# Patient Record
Sex: Male | Born: 1950 | Race: White | Hispanic: No | Marital: Married | Smoking: Never smoker
Health system: Southern US, Community
[De-identification: ages and names within clinical notes are randomized; demographics above are authoritative.]

## PROBLEM LIST (undated history)

## (undated) DIAGNOSIS — I10 Essential (primary) hypertension: Secondary | ICD-10-CM

## (undated) DIAGNOSIS — C73 Malignant neoplasm of thyroid gland: Secondary | ICD-10-CM

## (undated) DIAGNOSIS — E119 Type 2 diabetes mellitus without complications: Secondary | ICD-10-CM

## (undated) DIAGNOSIS — Z8489 Family history of other specified conditions: Secondary | ICD-10-CM

## (undated) DIAGNOSIS — N400 Enlarged prostate without lower urinary tract symptoms: Secondary | ICD-10-CM

## (undated) HISTORY — PX: LAPAROSCOPIC CHOLECYSTECTOMY: SUR755

## (undated) HISTORY — PX: LYMPH NODE BIOPSY: SHX201

---

## 2001-09-25 ENCOUNTER — Encounter: Payer: Self-pay | Admitting: Emergency Medicine

## 2001-09-25 ENCOUNTER — Inpatient Hospital Stay (HOSPITAL_COMMUNITY): Admission: EM | Admit: 2001-09-25 | Discharge: 2001-09-29 | Payer: Self-pay | Admitting: Emergency Medicine

## 2001-09-28 ENCOUNTER — Encounter: Payer: Self-pay | Admitting: General Surgery

## 2004-05-10 ENCOUNTER — Emergency Department (HOSPITAL_COMMUNITY): Admission: EM | Admit: 2004-05-10 | Discharge: 2004-05-10 | Payer: Self-pay | Admitting: Emergency Medicine

## 2004-12-17 ENCOUNTER — Emergency Department (HOSPITAL_COMMUNITY): Admission: EM | Admit: 2004-12-17 | Discharge: 2004-12-17 | Payer: Self-pay | Admitting: Emergency Medicine

## 2013-11-19 ENCOUNTER — Ambulatory Visit: Payer: BC Managed Care – PPO | Admitting: Family Medicine

## 2013-11-19 VITALS — BP 132/88 | HR 63 | Temp 98.9°F | Resp 16 | Ht 70.0 in | Wt 215.0 lb

## 2013-11-19 DIAGNOSIS — Z0289 Encounter for other administrative examinations: Secondary | ICD-10-CM

## 2013-11-19 NOTE — Progress Notes (Signed)
Games developer Medical Examination   Barry Gardner is a 63 y.o. male who presents today for a commercial driver fitness determination physical exam. The patient reports no problems. Pt prev PCP Dr. Hilda Lias (sp?) recently retired and he has not yet established w/ a new PCP - waiting to hear back from [facility]. He does not check his cbgs and has not seen a physician in about 6-12 mos though is still taking his medication. He does not know what his hgba1c is. The following portions of the patient's history were reviewed and updated as appropriate: allergies, current medications, past family history, past medical history, past social history, past surgical history and problem list. Review of Systems A comprehensive review of systems was negative.   Objective:    Vision:  Uncorrected Corrected Horizontal Field of Vision  Right Eye 20/25 na 85 degrees  Left Eye  20/40 na 85 degrees  Both Eyes  60/63 na    Applicant can recognize and distinguish among traffic control signals and devices showing standard red, green, and amber colors.     Monocular Vision?: No   Hearing:   500 Hz 1000 Hz 2000 Hz 4000 Hz  Right Ear  na na na na  Left Ear  na na na na  Can hear whisper at 10 feet bilaterally.     BP 132/88  Pulse 63  Temp(Src) 98.9 F (37.2 C) (Oral)  Resp 16  Ht 5\' 10"  (1.778 m)  Wt 215 lb (97.523 kg)  BMI 30.85 kg/m2  SpO2 97%  General Appearance:    Alert, cooperative, no distress, appears stated age  Head:    Normocephalic, without obvious abnormality, atraumatic  Eyes:    PERRL, conjunctiva/corneas clear, EOM's intact, fundi    benign, both eyes       Ears:    Normal TM's and external ear canals, both ears  Nose:   Nares normal, septum midline, mucosa normal, no drainage    or sinus tenderness  Throat:   Lips, mucosa, and tongue normal; teeth and gums normal  Neck:   Supple,  trachea midline, large left thyroid mass  Back:     Symmetric, no curvature, ROM normal,  no CVA tenderness  Lungs:     Left lower lobe expiratory wheeze, respirations unlabored  Chest wall:    No tenderness or deformity  Heart:    Regular rate and rhythm, S1 and S2 normal, no murmur, rub   or gallop  Abdomen:     Soft, non-tender, bowel sounds active all four quadrants,    no masses, no organomegaly  Genitalia:    Normal male without lesion, discharge or tenderness     Extremities:   Extremities normal, atraumatic, no cyanosis or edema, decreased ROM in left knee  Pulses:   2+ and symmetric all extremities, large amount of varicose veins over legs  Skin:   Skin color, texture, turgor normal, no rashes or lesions  Lymph nodes:   Cervical, supraclavicular, and axillary nodes normal  Neurologic:   CNII-XII intact. Normal strength, sensation and reflexes      throughout    Labs: No results found for this basename: SPECGRAV, PROTEINUR, BILIRUBINUR, GLUCOSEU  SG 1.030, neg prot, neg blood, 2+ sugar    Assessment:    Healthy male exam.  Meets standards, but periodic monitoring required due to HTN, DM.  Driver qualified only for 6 month.   Needs to establish w/ new PCP and bring letter stating that DM is well-controlled or  what changes are being made to get it well-controlled before new card in 6 mos. Pt understands and agrees to planh. Plan:    Medical examiners certificate completed and printed. Return as needed.

## 2014-05-20 ENCOUNTER — Ambulatory Visit (INDEPENDENT_AMBULATORY_CARE_PROVIDER_SITE_OTHER): Payer: BC Managed Care – PPO | Admitting: Family Medicine

## 2014-05-20 VITALS — BP 178/88 | HR 91 | Temp 98.4°F | Resp 18 | Ht 70.75 in | Wt 217.6 lb

## 2014-05-20 DIAGNOSIS — I10 Essential (primary) hypertension: Secondary | ICD-10-CM

## 2014-05-20 DIAGNOSIS — E118 Type 2 diabetes mellitus with unspecified complications: Secondary | ICD-10-CM

## 2014-05-20 LAB — POCT GLYCOSYLATED HEMOGLOBIN (HGB A1C): Hemoglobin A1C: 12.2

## 2014-05-20 LAB — GLUCOSE, POCT (MANUAL RESULT ENTRY): POC Glucose: 296 mg/dl — AB (ref 70–99)

## 2014-05-20 MED ORDER — TAMSULOSIN HCL 0.4 MG PO CAPS: 0.4000 mg | ORAL_CAPSULE | Freq: Every day | ORAL | Status: DC

## 2014-05-20 MED ORDER — FREESTYLE SYSTEM KIT: 1.0000 | PACK | Status: DC | PRN

## 2014-05-20 MED ORDER — QUINAPRIL HCL 40 MG PO TABS: 40.0000 mg | ORAL_TABLET | Freq: Every day | ORAL | Status: DC

## 2014-05-20 MED ORDER — METFORMIN HCL ER 500 MG PO TB24: 2000.0000 mg | ORAL_TABLET | Freq: Every day | ORAL | Status: DC

## 2014-05-20 MED ORDER — SITAGLIPTIN PHOSPHATE 100 MG PO TABS: 100.0000 mg | ORAL_TABLET | Freq: Every day | ORAL | Status: DC

## 2014-05-20 NOTE — Patient Instructions (Signed)
Diabetes and Standards of Medical Care Diabetes is complicated. You may find that your diabetes team includes a dietitian, nurse, diabetes educator, eye doctor, and more. To help everyone know what is going on and to help you get the care you deserve, the following schedule of care was developed to help keep you on track. Below are the tests, exams, vaccines, medicines, education, and plans you will need. HbA1c test This test shows how well you have controlled your glucose over the past 2-3 months. It is used to see if your diabetes management plan needs to be adjusted.   It is performed at least 2 times a year if you are meeting treatment goals.  It is performed 4 times a year if therapy has changed or if you are not meeting treatment goals. Blood pressure test  This test is performed at every routine medical visit. The goal is less than 140/90 mm Hg for most people, but 130/80 mm Hg in some cases. Ask your health care provider about your goal. Dental exam  Follow up with the dentist regularly. Eye exam  If you are diagnosed with type 1 diabetes as a child, get an exam upon reaching the age of 37 years or older and have had diabetes for 3-5 years. Yearly eye exams are recommended after that initial eye exam.  If you are diagnosed with type 1 diabetes as an adult, get an exam within 5 years of diagnosis and then yearly.  If you are diagnosed with type 2 diabetes, get an exam as soon as possible after the diagnosis and then yearly. Foot care exam  Visual foot exams are performed at every routine medical visit. The exams check for cuts, injuries, or other problems with the feet.  A comprehensive foot exam should be done yearly. This includes visual inspection as well as assessing foot pulses and testing for loss of sensation.  Check your feet nightly for cuts, injuries, or other problems with your feet. Tell your health care provider if anything is not healing. Kidney function test (urine  microalbumin)  This test is performed once a year.  Type 1 diabetes: The first test is performed 5 years after diagnosis.  Type 2 diabetes: The first test is performed at the time of diagnosis.  A serum creatinine and estimated glomerular filtration rate (eGFR) test is done once a year to assess the level of chronic kidney disease (CKD), if present. Lipid profile (cholesterol, HDL, LDL, triglycerides)  Performed every 5 years for most people.  The goal for LDL is less than 100 mg/dL. If you are at high risk, the goal is less than 70 mg/dL.  The goal for HDL is 40 mg/dL-50 mg/dL for men and 50 mg/dL-60 mg/dL for women. An HDL cholesterol of 60 mg/dL or higher gives some protection against heart disease.  The goal for triglycerides is less than 150 mg/dL. Influenza vaccine, pneumococcal vaccine, and hepatitis B vaccine  The influenza vaccine is recommended yearly.  It is recommended that people with diabetes who are over 24 years old get the pneumonia vaccine. In some cases, two separate shots may be given. Ask your health care provider if your pneumonia vaccination is up to date.  The hepatitis B vaccine is also recommended for adults with diabetes. Diabetes self-management education  Education is recommended at diagnosis and ongoing as needed. Treatment plan  Your treatment plan is reviewed at every medical visit. Document Released: 04/25/2009 Document Revised: 11/12/2013 Document Reviewed: 11/28/2012 [facility] Patient Information 2015 [place],  [employer]. This information is not intended to replace advice given to you by your health care provider. Make sure you discuss any questions you have with your health care provider. Diabetes Mellitus and Food It is important for you to manage your blood sugar (glucose) level. Your blood glucose level can be greatly affected by what you eat. Eating healthier foods in the appropriate amounts throughout the day at about the same time each day will  help you control your blood glucose level. It can also help slow or prevent worsening of your diabetes mellitus. Healthy eating may even help you improve the level of your blood pressure and reach or maintain a healthy weight.  HOW CAN FOOD AFFECT ME? Carbohydrates Carbohydrates affect your blood glucose level more than any other type of food. Your dietitian will help you determine how many carbohydrates to eat at each meal and teach you how to count carbohydrates. Counting carbohydrates is important to keep your blood glucose at a healthy level, especially if you are using insulin or taking certain medicines for diabetes mellitus. Alcohol Alcohol can cause sudden decreases in blood glucose (hypoglycemia), especially if you use insulin or take certain medicines for diabetes mellitus. Hypoglycemia can be a life-threatening condition. Symptoms of hypoglycemia (sleepiness, dizziness, and disorientation) are similar to symptoms of having too much alcohol.  If your health care provider has given you approval to drink alcohol, do so in moderation and use the following guidelines:  Women should not have more than one drink per day, and men should not have more than two drinks per day. One drink is equal to:  12 oz of beer.  5 oz of wine.  1 oz of hard liquor.  Do not drink on an empty stomach.  Keep yourself hydrated. Have water, diet soda, or unsweetened iced tea.  Regular soda, juice, and other mixers might contain a lot of carbohydrates and should be counted. WHAT FOODS ARE NOT RECOMMENDED? As you make food choices, it is important to remember that all foods are not the same. Some foods have fewer nutrients per serving than other foods, even though they might have the same number of calories or carbohydrates. It is difficult to get your body what it needs when you eat foods with fewer nutrients. Examples of foods that you should avoid that are high in calories and carbohydrates but low in  nutrients include:  Trans fats (most processed foods list trans fats on the Nutrition Facts label).  Regular soda.  Juice.  Candy.  Sweets, such as cake, pie, doughnuts, and cookies.  Fried foods. WHAT FOODS CAN I EAT? Have nutrient-rich foods, which will nourish your body and keep you healthy. The food you should eat also will depend on several factors, including:  The calories you need.  The medicines you take.  Your weight.  Your blood glucose level.  Your blood pressure level.  Your cholesterol level. You also should eat a variety of foods, including:  Protein, such as meat, poultry, fish, tofu, nuts, and seeds (lean animal proteins are best).  Fruits.  Vegetables.  Dairy products, such as milk, cheese, and yogurt (low fat is best).  Breads, grains, pasta, cereal, rice, and beans.  Fats such as olive oil, trans fat-free margarine, canola oil, avocado, and olives. DOES EVERYONE WITH DIABETES MELLITUS HAVE THE SAME MEAL PLAN? Because every person with diabetes mellitus is different, there is not one meal plan that works for everyone. It is very important that you meet with a  dietitian who will help you create a meal plan that is just right for you. Document Released: 03/25/2005 Document Revised: 07/03/2013 Document Reviewed: 05/25/2013 [facility] Patient Information 2015 [place], [place]. This information is not intended to replace advice given to you by your health care provider. Make sure you discuss any questions you have with your health care provider.

## 2014-05-21 LAB — COMPREHENSIVE METABOLIC PANEL
ALBUMIN: 4.4 g/dL (ref 3.5–5.2)
ALT: 14 U/L (ref 0–53)
AST: 12 U/L (ref 0–37)
Alkaline Phosphatase: 75 U/L (ref 39–117)
BUN: 15 mg/dL (ref 6–23)
CHLORIDE: 97 meq/L (ref 96–112)
CO2: 30 meq/L (ref 19–32)
CREATININE: 1.08 mg/dL (ref 0.50–1.35)
Calcium: 9.5 mg/dL (ref 8.4–10.5)
Glucose, Bld: 325 mg/dL — ABNORMAL HIGH (ref 70–99)
POTASSIUM: 3.8 meq/L (ref 3.5–5.3)
Sodium: 135 mEq/L (ref 135–145)
TOTAL PROTEIN: 6.8 g/dL (ref 6.0–8.3)
Total Bilirubin: 0.5 mg/dL (ref 0.2–1.2)

## 2014-05-21 LAB — MICROALBUMIN, URINE: Microalb, Ur: 1.3 mg/dL (ref <2.0)

## 2014-05-22 ENCOUNTER — Ambulatory Visit (INDEPENDENT_AMBULATORY_CARE_PROVIDER_SITE_OTHER): Payer: Self-pay | Admitting: Family Medicine

## 2014-05-22 ENCOUNTER — Encounter: Payer: Self-pay | Admitting: Family Medicine

## 2014-05-22 VITALS — BP 140/74 | HR 70 | Temp 98.4°F | Resp 16 | Ht 70.5 in | Wt 216.0 lb

## 2014-05-22 DIAGNOSIS — Z021 Encounter for pre-employment examination: Secondary | ICD-10-CM

## 2014-05-22 DIAGNOSIS — Z0000 Encounter for general adult medical examination without abnormal findings: Secondary | ICD-10-CM

## 2014-05-22 NOTE — Progress Notes (Signed)
Subjective:   This chart was scribed for Barry Haber, MD by Barry Gardner, [facility] Scribe. This patient was seen in room 11 and the patient's care was started 8:10 PM.    Patient ID: Barry Gardner, male    DOB: 12-28-1950, 63 y.o.   MRN: [id number]  HPI  Chief Complaint  Patient presents with   Employment Physical    DOT    HPI Comments: Barry Gardner is a 63 y.o. 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 Last A1C reading 12.2 2 days ago. Pt is not currently driving but is planning to keep his license active. Barry Gardner was started on Januvia yesterday 11/10 and has noticed improvement in blood pressure readings since restarting all medications. No known allergies to medications. No other concerns this visit.  There are no active problems to display for this patient.  Past Medical History  Diagnosis Date   Diabetes mellitus without complication    Past Surgical History  Procedure Laterality Date   Cholecystectomy     Gallbladder surgery     No Known Allergies Prior to Admission medications   Medication Sig Start Date End Date Taking? Authorizing Provider  Ascorbic Acid (VITAMIN C) 1000 MG tablet Take 1,000 mg by mouth daily.   Yes Historical Provider, MD  aspirin 81 MG tablet Take 81 mg by mouth daily.   Yes Historical Provider, MD  bisoprolol-hydrochlorothiazide (ZIAC) 10-6.25 MG per tablet Take 1 tablet by mouth daily.   Yes Historical Provider, MD  Cholecalciferol (VITAMIN D-3) 5000 UNITS TABS Take by mouth.   Yes Historical Provider, MD  glucose monitoring kit (FREESTYLE) monitoring kit 1 each by Does not apply route as needed for other. 05/20/14  Yes Shawnee Knapp, MD  LYCOPENE PO Take by mouth daily.   Yes Historical Provider, MD  metFORMIN (GLUCOPHAGE-XR) 500 MG 24 hr tablet Take 4 tablets (2,000 mg total) by mouth daily with breakfast. 05/20/14  Yes Shawnee Knapp, MD    multivitamin-iron-minerals-folic acid (CENTRUM) chewable tablet Chew 1 tablet by mouth daily.   Yes Historical Provider, MD  quinapril (ACCUPRIL) 40 MG tablet Take 1 tablet (40 mg total) by mouth daily. 05/20/14  Yes Shawnee Knapp, MD  Saw Palmetto, Serenoa repens, (SAW PALMETTO PO) Take by mouth 2 (two) times daily.   Yes Historical Provider, MD  sitaGLIPtin (JANUVIA) 100 MG tablet Take 1 tablet (100 mg total) by mouth daily. 05/20/14  Yes Shawnee Knapp, MD  tamsulosin (FLOMAX) 0.4 MG CAPS capsule Take 1 capsule (0.4 mg total) by mouth daily. 05/20/14  Yes Shawnee Knapp, MD    Review of Systems  Constitutional: Negative for fever, chills and unexpected weight change.  Respiratory: Negative for shortness of breath.   Skin: Negative for rash.  Neurological: Negative for weakness and numbness.  Psychiatric/Behavioral: Negative for confusion.  All other systems reviewed and are negative.   Triage Vitals: BP 140/74 mmHg   Pulse 70   Temp(Src) 98.4 F (36.9 C) (Oral)   Resp 16   Ht 5' 10.5" (1.791 m)   Wt 216 lb (97.977 kg)   BMI 30.54 kg/m2   SpO2 97%   Objective:  Physical Exam  Constitutional: He is oriented to person, place, and time. He appears well-developed and well-nourished.  HENT:  Head: Normocephalic and atraumatic.  Eyes: EOM are normal.  Neck: Normal range of motion.  Cardiovascular: Normal rate, regular rhythm, normal heart sounds and intact distal pulses.   Pulmonary/Chest: Effort normal and breath sounds normal. No respiratory distress.  Abdominal: Soft. He exhibits no distension. There is no tenderness.  Musculoskeletal: Normal range of motion.  Neurological: He is alert and oriented to person, place, and time.  Skin: Skin is warm and dry.  Psychiatric: He has a normal mood and affect. Judgment normal.  Nursing note and vitals reviewed.    Assessment & Plan:   I personally performed the services described in this documentation, which was scribed in my presence. The recorded  information has been reviewed and is accurate.    No diagnosis found.  Signed, Barry Haber, MD

## 2014-05-22 NOTE — Progress Notes (Signed)
Subjective:  This chart was scribed for Barry Gardner. Barry Pulse, MD by Barry Gardner, ED Scribe. This patient was seen in room 13 and the patient's care was started at 9:30 PM.     Patient ID: Barry Gardner, male    DOB: [birth year], 63 y.o.   MRN: [id number] Chief Complaint  Patient presents with  . Employment Physical    dot    HPI PCP: No PCP Per Patient HPI Comments: Barry Gardner is a 63 y.o. male who presents to the [facility] for his self-pay DOT exam but agrees to be seen for eval of his DM and HTN instead as informed would not be eligible for >1 mo DOT card with his current VS and UA.  Pt is here with his daughter who lives w/ him and keeps track of his medication and compliance.  Pt reports taking metformin ER 500 mg 2 tabs bid. Pt gets his meds free from [employer]. Wife also has DM and pt takes his wife's Metformin when he does not have his own. Pt's daughter recently moved in and is taking control over managing pt's health including controlling his DM. Pt denies checking his glucose levels at home. Pt is also on Accupril 62m, however, he failed to take his it today. Pt presents to the clinic without fasting and states that he had a carbohydrate rich dinner two hours ago. Pt currently lacks a PCP and is in the process of establishing care at the clinic; pt notes that he has not seen a provider in over a year. Pt reports chronic nocturia 3x a night and is on flomax. Pt has no specific complaints at this time and denies any Sx.   Past Medical History  Diagnosis Date  . Diabetes mellitus without complication    No current outpatient prescriptions on file prior to visit.   No current facility-administered medications on file prior to visit.   No Known Allergies  Review of Systems  Constitutional: Negative for fever, chills, activity change, appetite change and unexpected weight change.  Respiratory: Negative for shortness of breath.   Cardiovascular: Negative for  chest pain and leg swelling.  Gastrointestinal: Negative for nausea, vomiting, abdominal pain, diarrhea and constipation.  Neurological: Negative for numbness.  Psychiatric/Behavioral: Negative for confusion and sleep disturbance.       Objective:   Physical Exam  Constitutional: He is oriented to person, place, and time. He appears well-developed and well-nourished. No distress.  HENT:  Head: Normocephalic and atraumatic.  Normal thyroid.   Eyes: Conjunctivae and EOM are normal.  Neck: Neck supple.  Cardiovascular: Normal rate and regular rhythm.   Pulmonary/Chest: Effort normal and breath sounds normal. No respiratory distress.  Musculoskeletal: Normal range of motion.  Neurological: He is alert and oriented to person, place, and time.  Skin: Skin is warm and dry.  Psychiatric: He has a normal mood and affect. His behavior is normal.  Nursing note and vitals reviewed.  BP 178/88 mmHg  Gardner 91  Temp(Src) 98.4 F (36.9 C) (Oral)  Resp 18  Ht 5' 10.75" (1.797 m)  Wt 217 lb 9.6 oz (98.703 kg)  BMI 30.57 kg/m2  SpO2 96%  Results for orders placed or performed in visit on 05/20/14  Microalbumin, urine  Result Value Ref Range   Microalb, Ur 1.3 <2.0 mg/dL  Comprehensive metabolic panel  Result Value Ref Range   Sodium 135 135 - 145 mEq/L   Potassium 3.8 3.5 - 5.3 mEq/L  Chloride 97 96 - 112 mEq/L   CO2 30 19 - 32 mEq/L   Glucose, Bld 325 (H) 70 - 99 mg/dL   BUN 15 6 - 23 mg/dL   Creat 1.08 0.50 - 1.35 mg/dL   Total Bilirubin 0.5 0.2 - 1.2 mg/dL   Alkaline Phosphatase 75 39 - 117 U/L   AST 12 0 - 37 U/L   ALT 14 0 - 53 U/L   Total Protein 6.8 6.0 - 8.3 g/dL   Albumin 4.4 3.5 - 5.2 g/dL   Calcium 9.5 8.4 - 10.5 mg/dL  POCT glucose (manual entry)  Result Value Ref Range   POC Glucose 296 (A) 70 - 99 mg/dl  POCT glycosylated hemoglobin (Hb A1C)  Result Value Ref Range   Hemoglobin A1C 12.2        Assessment & Plan:  DIAGNOSTIC STUDIES: Oxygen Saturation is 96%  on RA, adequate by my interpretation.    COORDINATION OF CARE: 9:45 PM-Discussed treatment plan which includes recheck with pt at bedside and pt agreed to plan.  Type 2 diabetes mellitus with complication - Plan: POCT glucose (manual entry), POCT glycosylated hemoglobin (Hb A1C), Microalbumin, urine, Comprehensive metabolic panel, Ambulatory referral to Ophthalmology - I saw pt 6 mos ago for his DOT and requested he bring a note from his provider re his DM control before renewal.  He has not seen a provider - plans to establish medical care here so visit tonight changed to establish medical care as I will not renew DOT card for > 1 mo with current BP and glucosuria - informed pt he can RTC in 1-2d to see another provider for his DOT.  Cont metformin - discussed ways to increase compliance w/ bid dosing by using ER formulation and ok to combine to qd dosing.  Start [place] - recommended sulfonylurea to start after metformin but want to try Januvia as wife is doing well on it as well and has coupon. Start asa.  Needs flp at f/u Pt requests all labs be sent to [facility] for his insurance cvg.   Essential hypertension - Plan: Comprehensive metabolic panel - was prev well controlled on accupril - restart.   Meds ordered this encounter  Medications  . multivitamin-iron-minerals-folic acid (CENTRUM) chewable tablet    Sig: Chew 1 tablet by mouth daily.  Marland Kitchen aspirin 81 MG tablet    Sig: Take 81 mg by mouth daily.  . metFORMIN (GLUCOPHAGE-XR) 500 MG 24 hr tablet    Sig: Take 4 tablets (2,000 mg total) by mouth daily with breakfast.    Dispense:  360 tablet    Refill:  1    This is a 3 month supply. Ok to dispense at 1 mo at a time if pt prefers  . sitaGLIPtin (JANUVIA) 100 MG tablet    Sig: Take 1 tablet (100 mg total) by mouth daily.    Dispense:  90 tablet    Refill:  1    This is a 3 month supply. Ok to dispense at 1 mo at a time if pt prefers  . quinapril (ACCUPRIL) 40 MG tablet    Sig: Take 1 tablet  (40 mg total) by mouth daily.    Dispense:  90 tablet    Refill:  1  . tamsulosin (FLOMAX) 0.4 MG CAPS capsule    Sig: Take 1 capsule (0.4 mg total) by mouth daily.    Dispense:  90 capsule    Refill:  1  . DISCONTD: glucose monitoring kit (  FREESTYLE) monitoring kit    Sig: 1 each by Does not apply route as needed for other. 100 lancets and 100 test strips, with 1 year refills. Check sugars qd. Patient wants Freestyle kit.    Dispense:  1 each    Refill:  0  . DISCONTD: glucose monitoring kit (FREESTYLE) monitoring kit    Sig: 1 each by Does not apply route as needed for other. 100 lancets and 100 test strips, with 1 year refills. Check sugars qd. Patient wants Freestyle kit.    Dispense:  1 each    Refill:  0  . glucose monitoring kit (FREESTYLE) monitoring kit    Sig: 1 each by Does not apply route as needed for other.    Dispense:  1 each    Refill:  0    100 lancets and 100 test strips, with 1 year refills. Check sugars qd. Patient wants Freestyle kit.    I personally performed the services described in this documentation, which was scribed in my presence. The recorded information has been reviewed and considered, and addended by me as needed.  [name], MD MPH

## 2014-05-23 ENCOUNTER — Other Ambulatory Visit: Payer: Self-pay

## 2014-05-23 MED ORDER — BISOPROLOL-HYDROCHLOROTHIAZIDE 10-6.25 MG PO TABS: 1.0000 | ORAL_TABLET | Freq: Every day | ORAL | Status: DC

## 2014-05-23 NOTE — Telephone Encounter (Signed)
pts daughter called and stated that she had the med asst make a note that pt's Ziac Rx needed to be sent in, but she doesn't think it was done bc they did not discuss with Dr L. I don't see that it was sent. Dr L, pt had restarted back on this med and his BP was already improving, so she is sure you probably want him to remain on this. Daughter had not realized pt was supposed to be taking this when they were here to see Dr Brigitte Pulse the day before. Can we send Rx for pt?

## 2014-05-24 ENCOUNTER — Telehealth: Payer: Self-pay

## 2014-05-24 NOTE — Telephone Encounter (Signed)
Called pt to see if he wants to get DM supplies through [facility] and was advised that he gets all of these from [facility] or [facility]. We had just sent a Rx to [facility]. Denied order to [facility]

## 2014-10-21 ENCOUNTER — Other Ambulatory Visit: Payer: Self-pay | Admitting: Family Medicine

## 2014-11-15 ENCOUNTER — Other Ambulatory Visit: Payer: Self-pay | Admitting: Physician Assistant

## 2014-12-15 ENCOUNTER — Other Ambulatory Visit: Payer: Self-pay | Admitting: Physician Assistant

## 2014-12-30 ENCOUNTER — Ambulatory Visit (INDEPENDENT_AMBULATORY_CARE_PROVIDER_SITE_OTHER): Payer: BLUE CROSS/BLUE SHIELD | Admitting: Family Medicine

## 2014-12-30 VITALS — BP 164/76 | HR 76 | Temp 97.7°F | Resp 18 | Ht 71.5 in | Wt 229.0 lb

## 2014-12-30 DIAGNOSIS — E1165 Type 2 diabetes mellitus with hyperglycemia: Secondary | ICD-10-CM | POA: Diagnosis not present

## 2014-12-30 DIAGNOSIS — E559 Vitamin D deficiency, unspecified: Secondary | ICD-10-CM | POA: Diagnosis not present

## 2014-12-30 DIAGNOSIS — N400 Enlarged prostate without lower urinary tract symptoms: Secondary | ICD-10-CM

## 2014-12-30 DIAGNOSIS — I10 Essential (primary) hypertension: Secondary | ICD-10-CM | POA: Insufficient documentation

## 2014-12-30 DIAGNOSIS — IMO0002 Reserved for concepts with insufficient information to code with codable children: Secondary | ICD-10-CM | POA: Insufficient documentation

## 2014-12-30 LAB — POCT GLYCOSYLATED HEMOGLOBIN (HGB A1C): HEMOGLOBIN A1C: 7.3

## 2014-12-30 MED ORDER — QUINAPRIL HCL 40 MG PO TABS: 40.0000 mg | ORAL_TABLET | Freq: Every day | ORAL | Status: DC

## 2014-12-30 MED ORDER — HYDROCHLOROTHIAZIDE 25 MG PO TABS: 25.0000 mg | ORAL_TABLET | Freq: Every day | ORAL | Status: DC

## 2014-12-30 MED ORDER — TAMSULOSIN HCL 0.4 MG PO CAPS: 0.4000 mg | ORAL_CAPSULE | Freq: Every day | ORAL | Status: DC

## 2014-12-30 MED ORDER — METFORMIN HCL ER (OSM) 1000 MG PO TB24: 2000.0000 mg | ORAL_TABLET | Freq: Every day | ORAL | Status: DC

## 2014-12-30 MED ORDER — ATENOLOL 100 MG PO TABS: 100.0000 mg | ORAL_TABLET | Freq: Every day | ORAL | Status: DC

## 2014-12-30 MED ORDER — SITAGLIPTIN PHOSPHATE 100 MG PO TABS: 100.0000 mg | ORAL_TABLET | Freq: Every day | ORAL | Status: DC

## 2014-12-30 NOTE — Progress Notes (Signed)
Subjective:  This chart was scribed for Barry Cheadle, MD by [name], Medical Scribe. This patient was seen in Room 14 and the patient's care was started 7:15 PM.    Patient ID: Barry Gardner, male    DOB: 02/21/1951, 64 y.o.   MRN: [id number] Chief Complaint  Patient presents with  . diabetic check up    3 mth diabetic f/u    HPI Barry Gardner is a 64 y.o. male who presents to [facility] for diabetic follow up.  Last saw pt 7 months ago. He has type 2 DM and also high BP. 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 He has very poor disease education.  His a1c was 12.2. On metformin 1000 BID.  His BP is also very uncontrolled. On accupril.  At last visit, we increased his medication compliance changing metformin to XR dosing taken with breakfast with [place]. Started aspirin and restarted his accupril. Advised for fasting lipid panel.  Normal microalbumin in Nov.   He is not checking his blood sugar regularly. He states that he's been feeling fatigue as he works 7 days a week. He does have a meter at home. He still takes medicine regularly.  He took his medication today. He has eaten today 8 hours ago.   He's on Centrum Silver for men. He has seen his eye doctor in past year. He denies sores on his feet.   Past Medical History  Diagnosis Date  . Diabetes mellitus without complication    Current Outpatient Prescriptions on File Prior to Visit  Medication Sig Dispense Refill  . Ascorbic Acid (VITAMIN C) 1000 MG tablet Take 1,000 mg by mouth daily.    Marland Kitchen aspirin 81 MG tablet Take 81 mg by mouth daily.    . bisoprolol-hydrochlorothiazide (ZIAC) 10-6.25 MG per tablet TAKE 1 TABLET BY MOUTH AT BEDTIME.  "OV NEEDED FOR ADDITIONAL REFILLS" 30 tablet 0  . Cholecalciferol (VITAMIN D-3) 5000 UNITS TABS Take by mouth.    Marland Kitchen glucose monitoring kit (FREESTYLE) monitoring kit 1 each by Does not apply route as needed for other. 1 each 0  . LYCOPENE PO Take by mouth  daily.    . metFORMIN (GLUCOPHAGE-XR) 500 MG 24 hr tablet Take 4 tablets (2,000 mg total) by mouth daily with breakfast. NO MORE REFILLS WITHOUT OFFICE VISIT - 2ND NOTICE 60 tablet 0  . multivitamin-iron-minerals-folic acid (CENTRUM) chewable tablet Chew 1 tablet by mouth daily.    . quinapril (ACCUPRIL) 40 MG tablet Take 1 tablet (40 mg total) by mouth daily. NO MORE REFILLS WITHOUT OFFICE VISIT - 2ND NOTICE 15 tablet 0  . Saw Palmetto, Serenoa repens, (SAW PALMETTO PO) Take by mouth 2 (two) times daily.    . sitaGLIPtin (JANUVIA) 100 MG tablet Take 1 tablet (100 mg total) by mouth daily. NO MORE REFILLS WITHOUT OFFICE VISIT - 2ND NOTICE 15 tablet 0  . tamsulosin (FLOMAX) 0.4 MG CAPS capsule Take 1 capsule (0.4 mg total) by mouth daily. NO MORE REFILLS WITHOUT OFFICE VISIT - 2ND NOTICE 15 capsule 0   No current facility-administered medications on file prior to visit.   No Known Allergies    Review of Systems  Constitutional: Positive for fatigue. Negative for fever, chills and appetite change.  HENT: Negative for congestion, rhinorrhea, sneezing and sore throat.   Respiratory: Negative for cough and shortness of breath.   Gastrointestinal: Negative for nausea, vomiting and diarrhea.  Neurological: Negative  for numbness.       Objective:   Physical Exam  Constitutional: He is oriented to person, place, and time. He appears well-developed and well-nourished. No distress.  HENT:  Head: Normocephalic and atraumatic.  Eyes: EOM are normal. Pupils are equal, round, and reactive to light.  Neck: Neck supple.  Cardiovascular: Normal rate.   Pulmonary/Chest: Effort normal. No respiratory distress.  Musculoskeletal: Normal range of motion.  Neurological: He is alert and oriented to person, place, and time.  Skin: Skin is warm and dry.  Psychiatric: He has a normal mood and affect. His behavior is normal.  Nursing note and vitals reviewed.   BP 164/76 mmHg  Pulse 76  Temp(Src) 97.7 F  (36.5 C) (Oral)  Resp 18  Ht 5' 11.5" (1.816 m)  Wt 229 lb (103.874 kg)  BMI 31.50 kg/m2  SpO2 96%  Rechecked BP seated, reg cuff manually, in room: 170/80  Results for orders placed or performed in visit on 12/30/14  POCT glycosylated hemoglobin (Hb A1C)  Result Value Ref Range   Hemoglobin A1C 7.3         Assessment & Plan:  His daughter wants to know if he needs to start fish oil which I think is a good idea.  Trying to minimalize number of pills to pt compliance  1. Type 2 diabetes mellitus, uncontrolled - doing AMAZING - a1c decreased from 12.2 ->7.3 in the past 7 mos since his daughter moved in and started cooking and giving him his meds. Januvia costing pt $35/mo - we started this at pt request as his wife did well on it and he had a coupon but would be cheaper to put on sulfonylurea or invokana with coupon so call if need to change.   Daughter request metformin ER from 4 tabs a day to 2 tabs, even though more expensive, to increase compliance - ok to change to any form of the ER (mod, osm, reg) in any quantity that works best for cost as long as taking in 2083m total/d.  Cont asa, microalb 7 mos ago nml and on acei. If BP elev pt instructed to call and would have to add in amlodipine. Lipids at goal and will hold off on statin at this time as more pills is likely to decrease his compliance per pt and his daughter.   2. Essential hypertension, benign - start monitoring outside office - has to be <140/90 to keep his DOT card- going to need new card in Nov and plans to come here for it.  Change bisoprolol-hctz 10-6.25 qd to atenolol 100 and hctz 25 qd, cont accupril 40 qd - daughter worried about pt's compliance taking 3 pills instead of 2 but no way increase total doses w/o this (accupril/hctz is max at 20/25, bisoprolol highest pill is 10 but max dose is 20 so switched to atenolol.   3. Vitamin D deficiency - finishing 5000u qd and going to start 2000qd  4. BPH (benign prostatic  hypertrophy) - on flomax, no recent change in sxs, not seeing urology so needs psa - rec DRE at next CPE (or DOT exam).    Orders Placed This Encounter  Procedures  . Vit D  25 hydroxy (rtn osteoporosis monitoring)  . Lipid panel    Order Specific Question:  Has the patient fasted?    Answer:  Yes  . Comprehensive metabolic panel    Order Specific Question:  Has the patient fasted?    Answer:  Yes  .  PSA  . POCT glycosylated hemoglobin (Hb A1C)    Meds ordered this encounter  Medications  . b complex vitamins capsule    Sig: Take 1 capsule by mouth daily.  . quinapril (ACCUPRIL) 40 MG tablet    Sig: Take 1 tablet (40 mg total) by mouth daily.    Dispense:  30 tablet    Refill:  5  . hydrochlorothiazide (HYDRODIURIL) 25 MG tablet    Sig: Take 1 tablet (25 mg total) by mouth daily.    Dispense:  30 tablet    Refill:  5  . atenolol (TENORMIN) 100 MG tablet    Sig: Take 1 tablet (100 mg total) by mouth daily.    Dispense:  30 tablet    Refill:  5  . metFORMIN (FORTAMET) 1000 MG (OSM) 24 hr tablet    Sig: Take 2 tablets (2,000 mg total) by mouth daily with breakfast.    Dispense:  60 tablet    Refill:  11  . tamsulosin (FLOMAX) 0.4 MG CAPS capsule    Sig: Take 1 capsule (0.4 mg total) by mouth daily.    Dispense:  30 capsule    Refill:  11  . sitaGLIPtin (JANUVIA) 100 MG tablet    Sig: Take 1 tablet (100 mg total) by mouth daily.    Dispense:  30 tablet    Refill:  5    I personally performed the services described in this documentation, which was scribed in my presence. The recorded information has been reviewed and considered, and addended by me as needed.  Barry Cheadle, MD MPH

## 2014-12-30 NOTE — Patient Instructions (Addendum)
Continue the accupril.  Instead of the bisoprolol-hctz 10-6.25 which increased to atenolol 100 (max dose of the bisoprolol was 20) and increase hctz to 25 (normal dose). Diabetes doing amazing!!!!!!!!!!!!!!!!!!!!!!  Keep it up!!!!!!!!!!!!!!!!!  Managing Your High Blood Pressure Blood pressure is a measurement of how forceful your blood is pressing against the walls of the arteries. Arteries are muscular tubes within the circulatory system. Blood pressure does not stay the same. Blood pressure rises when you are active, excited, or nervous; and it lowers during sleep and relaxation. If the numbers measuring your blood pressure stay above normal most of the time, you are at risk for health problems. High blood pressure (hypertension) is a long-term (chronic) condition in which blood pressure is elevated. A blood pressure reading is recorded as two numbers, such as 120 over 80 (or 120/80). The first, higher number is called the systolic pressure. It is a measure of the pressure in your arteries as the heart beats. The second, lower number is called the diastolic pressure. It is a measure of the pressure in your arteries as the heart relaxes between beats.  Keeping your blood pressure in a normal range is important to your overall health and prevention of health problems, such as heart disease and stroke. When your blood pressure is uncontrolled, your heart has to work harder than normal. High blood pressure is a very common condition in adults because blood pressure tends to rise with age. Men and women are equally likely to have hypertension but at different times in life. Before age 6, men are more likely to have hypertension. After 65 years of age, women are more likely to have it. Hypertension is especially common in African Americans. This condition often has no signs or symptoms. The cause of the condition is usually not known. Your caregiver can help you come up with a plan to keep your blood pressure in  a normal, healthy range. BLOOD PRESSURE STAGES Blood pressure is classified into four stages: normal, prehypertension, stage 1, and stage 2. Your blood pressure reading will be used to determine what type of treatment, if any, is necessary. Appropriate treatment options are tied to these four stages:  Normal  Systolic pressure (mm Hg): below 120.  Diastolic pressure (mm Hg): below 80. Prehypertension  Systolic pressure (mm Hg): 120 to 139.  Diastolic pressure (mm Hg): 80 to 89. Stage1  Systolic pressure (mm Hg): 140 to 159.  Diastolic pressure (mm Hg): 90 to 99. Stage2  Systolic pressure (mm Hg): 160 or above.  Diastolic pressure (mm Hg): 100 or above. RISKS RELATED TO HIGH BLOOD PRESSURE Managing your blood pressure is an important responsibility. Uncontrolled high blood pressure can lead to:  A heart attack.  A stroke.  A weakened blood vessel (aneurysm).  Heart failure.  Kidney damage.  Eye damage.  Metabolic syndrome.  Memory and concentration problems. HOW TO MANAGE YOUR BLOOD PRESSURE Blood pressure can be managed effectively with lifestyle changes and medicines (if needed). Your caregiver will help you come up with a plan to bring your blood pressure within a normal range. Your plan should include the following: Education  Read all information provided by your caregivers about how to control blood pressure.  Educate yourself on the latest guidelines and treatment recommendations. New research is always being done to further define the risks and treatments for high blood pressure. Lifestylechanges  Control your weight.  Avoid smoking.  Stay physically active.  Reduce the amount of salt in your diet.  Reduce stress.  Control any chronic conditions, such as high cholesterol or diabetes.  Reduce your alcohol intake. Medicines  Several medicines (antihypertensive medicines) are available, if needed, to bring blood pressure within a normal  range. Communication  Review all the medicines you take with your caregiver because there may be side effects or interactions.  Talk with your caregiver about your diet, exercise habits, and other lifestyle factors that may be contributing to high blood pressure.  See your caregiver regularly. Your caregiver can help you create and adjust your plan for managing high blood pressure. RECOMMENDATIONS FOR TREATMENT AND FOLLOW-UP  The following recommendations are based on current guidelines for managing high blood pressure in nonpregnant adults. Use these recommendations to identify the proper follow-up period or treatment option based on your blood pressure reading. You can discuss these options with your caregiver.  Systolic pressure of 322 to 025 or diastolic pressure of 80 to 89: Follow up with your caregiver as directed.  Systolic pressure of 427 to 062 or diastolic pressure of 90 to 100: Follow up with your caregiver within 2 months.  Systolic pressure above 376 or diastolic pressure above 283: Follow up with your caregiver within 1 month.  Systolic pressure above 151 or diastolic pressure above 761: Consider antihypertensive therapy; follow up with your caregiver within 1 week.  Systolic pressure above 607 or diastolic pressure above 371: Begin antihypertensive therapy; follow up with your caregiver within 1 week. Document Released: 03/22/2012 Document Reviewed: 03/22/2012 [facility] Patient Information 2015 [place]. This information is not intended to replace advice given to you by your health care provider. Make sure you discuss any questions you have with your health care provider.  DASH Eating Plan DASH stands for "Dietary Approaches to Stop Hypertension." The DASH eating plan is a healthy eating plan that has been shown to reduce high blood pressure (hypertension). Additional health benefits may include reducing the risk of type 2 diabetes mellitus, heart disease, and stroke.  The DASH eating plan may also help with weight loss. WHAT DO I NEED TO KNOW ABOUT THE DASH EATING PLAN? For the DASH eating plan, you will follow these general guidelines:  Choose foods with a percent daily value for sodium of less than 5% (as listed on the food label).  Use salt-free seasonings or herbs instead of table salt or sea salt.  Check with your health care provider or pharmacist before using salt substitutes.  Eat lower-sodium products, often labeled as "lower sodium" or "no salt added."  Eat fresh foods.  Eat more vegetables, fruits, and low-fat dairy products.  Choose whole grains. Look for the word "whole" as the first word in the ingredient list.  Choose fish and skinless chicken or [place] more often than red meat. Limit fish, poultry, and meat to 6 oz (170 g) each day.  Limit sweets, desserts, sugars, and sugary drinks.  Choose heart-healthy fats.  Limit cheese to 1 oz (28 g) per day.  Eat more home-cooked food and less restaurant, buffet, and fast food.  Limit fried foods.  Cook foods using methods other than frying.  Limit canned vegetables. If you do use them, rinse them well to decrease the sodium.  When eating at a restaurant, ask that your food be prepared with less salt, or no salt if possible. WHAT FOODS CAN I EAT? Seek help from a dietitian for individual calorie needs. Grains Whole grain or whole wheat bread. Brown rice. Whole grain or whole wheat pasta. Quinoa, bulgur, and whole  grain cereals. Low-sodium cereals. Corn or whole wheat flour tortillas. Whole grain cornbread. Whole grain crackers. Low-sodium crackers. Vegetables Fresh or frozen vegetables (raw, steamed, roasted, or grilled). Low-sodium or reduced-sodium tomato and vegetable juices. Low-sodium or reduced-sodium tomato sauce and paste. Low-sodium or reduced-sodium canned vegetables.  Fruits All fresh, canned (in natural juice), or frozen fruits. Meat and Other Protein Products Ground  beef (85% or leaner), grass-fed beef, or beef trimmed of fat. Skinless chicken or [place]. Ground chicken or [place]. Pork trimmed of fat. All fish and seafood. Eggs. Dried beans, peas, or lentils. Unsalted nuts and seeds. Unsalted canned beans. Dairy Low-fat dairy products, such as skim or 1% milk, 2% or reduced-fat cheeses, low-fat ricotta or cottage cheese, or plain low-fat yogurt. Low-sodium or reduced-sodium cheeses. Fats and Oils Tub margarines without trans fats. Light or reduced-fat mayonnaise and salad dressings (reduced sodium). Avocado. Safflower, olive, or canola oils. Natural peanut or almond butter. Other Unsalted popcorn and pretzels. The items listed above may not be a complete list of recommended foods or beverages. Contact your dietitian for more options. WHAT FOODS ARE NOT RECOMMENDED? Grains White bread. White pasta. White rice. Refined cornbread. Bagels and croissants. Crackers that contain trans fat. Vegetables Creamed or fried vegetables. Vegetables in a cheese sauce. Regular canned vegetables. Regular canned tomato sauce and paste. Regular tomato and vegetable juices. Fruits Dried fruits. Canned fruit in light or heavy syrup. Fruit juice. Meat and Other Protein Products Fatty cuts of meat. Ribs, chicken wings, bacon, sausage, bologna, salami, chitterlings, fatback, hot dogs, bratwurst, and packaged luncheon meats. Salted nuts and seeds. Canned beans with salt. Dairy Whole or 2% milk, cream, half-and-half, and cream cheese. Whole-fat or sweetened yogurt. Full-fat cheeses or blue cheese. Nondairy creamers and whipped toppings. Processed cheese, cheese spreads, or cheese curds. Condiments Onion and garlic salt, seasoned salt, table salt, and sea salt. Canned and packaged gravies. Worcestershire sauce. Tartar sauce. Barbecue sauce. Teriyaki sauce. Soy sauce, including reduced sodium. Steak sauce. Fish sauce. Oyster sauce. Cocktail sauce. Horseradish. Ketchup and mustard. Meat  flavorings and tenderizers. Bouillon cubes. Hot sauce. Tabasco sauce. Marinades. Taco seasonings. Relishes. Fats and Oils Butter, stick margarine, lard, shortening, ghee, and bacon fat. Coconut, palm kernel, or palm oils. Regular salad dressings. Other Pickles and olives. Salted popcorn and pretzels. The items listed above may not be a complete list of foods and beverages to avoid. Contact your dietitian for more information. WHERE CAN I FIND MORE INFORMATION? National Heart, Lung, and Blood Institute: travelstabloid.com Document Released: 06/17/2011 Document Revised: 11/12/2013 Document Reviewed: 05/02/2013 [facility] Patient Information 2015 [place], [place]. This information is not intended to replace advice given to you by your health care provider. Make sure you discuss any questions you have with your health care provider.

## 2014-12-31 LAB — COMPREHENSIVE METABOLIC PANEL
ALT: 23 U/L (ref 0–53)
AST: 18 U/L (ref 0–37)
Albumin: 4.4 g/dL (ref 3.5–5.2)
Alkaline Phosphatase: 38 U/L — ABNORMAL LOW (ref 39–117)
BILIRUBIN TOTAL: 0.5 mg/dL (ref 0.2–1.2)
BUN: 14 mg/dL (ref 6–23)
CALCIUM: 9.6 mg/dL (ref 8.4–10.5)
CHLORIDE: 98 meq/L (ref 96–112)
CO2: 30 meq/L (ref 19–32)
Creat: 0.96 mg/dL (ref 0.50–1.35)
Glucose, Bld: 125 mg/dL — ABNORMAL HIGH (ref 70–99)
Potassium: 4 mEq/L (ref 3.5–5.3)
Sodium: 141 mEq/L (ref 135–145)
Total Protein: 6.8 g/dL (ref 6.0–8.3)

## 2014-12-31 LAB — LIPID PANEL
CHOLESTEROL: 158 mg/dL (ref 0–200)
HDL: 36 mg/dL — ABNORMAL LOW (ref >=40)
LDL Cholesterol: 93 mg/dL (ref 0–99)
Total CHOL/HDL Ratio: 4.4 Ratio
Triglycerides: 143 mg/dL (ref <150)
VLDL: 29 mg/dL (ref 0–40)

## 2014-12-31 LAB — VITAMIN D 25 HYDROXY (VIT D DEFICIENCY, FRACTURES): VIT D 25 HYDROXY: 54 ng/mL (ref 30–100)

## 2015-01-01 ENCOUNTER — Encounter: Payer: Self-pay | Admitting: Family Medicine

## 2015-01-01 LAB — PSA: PSA: 1.97 ng/mL (ref <=4.00)

## 2015-01-02 ENCOUNTER — Other Ambulatory Visit: Payer: Self-pay

## 2015-01-02 MED ORDER — METFORMIN HCL ER 500 MG PO TB24: 2000.0000 mg | ORAL_TABLET | Freq: Every day | ORAL | Status: DC

## 2015-01-02 NOTE — Telephone Encounter (Signed)
Pharm sent note asking for change to 500 mg ER tabs or change to immediate release d/t cost savings. I will send in a change to the 500 mg ER and double # taken QD so dose remains the same.

## 2015-01-03 ENCOUNTER — Other Ambulatory Visit: Payer: Self-pay | Admitting: Physician Assistant

## 2015-01-10 ENCOUNTER — Other Ambulatory Visit: Payer: Self-pay | Admitting: Physician Assistant

## 2015-01-10 ENCOUNTER — Telehealth: Payer: Self-pay | Admitting: Radiology

## 2015-01-10 NOTE — Telephone Encounter (Signed)
Pt lm on lab voicemail about labs. I called him back. No answer, lmom to cb.

## 2015-01-12 ENCOUNTER — Telehealth: Payer: Self-pay | Admitting: Family Medicine

## 2015-01-12 NOTE — Telephone Encounter (Signed)
Received a call from his daughter. They are in [place] and he was noted to have an engorged tick on his back.  They have pulled the tick off. Want to know what to do.  Instructed to watch for any rash or flu like sx such as fever or chills.  OW nothing is necessary at this time.

## 2015-02-17 ENCOUNTER — Telehealth: Payer: Self-pay

## 2015-02-19 ENCOUNTER — Telehealth: Payer: Self-pay | Admitting: *Deleted

## 2015-02-19 NOTE — Telephone Encounter (Signed)
Pts daughter called stating she was confused on the medication her father was suppose to be taking.  She states she called and left a message and no-one called her back. She states this was an important issue because it was concerning the medications he needed to be taking regarding his blood pressure.  She stated that the person she spoke with told her she just need to call the pharmacy.  After looking in EPIC there was no phone message recorded.  However, I informed the daughter of the last note Dr. Brigitte Pulse wrote regarding which medication he should be taking and not taking regarding his BP. She understood and was thankful for the information.    However, she wants the clinical manager to call her so that she may discuss her concern.  She may be reached at [phone] and her name is Giovanny Dugal.

## 2015-02-20 NOTE — Telephone Encounter (Signed)
Perfect thanks

## 2015-03-31 ENCOUNTER — Telehealth: Payer: Self-pay | Admitting: *Deleted

## 2015-03-31 MED ORDER — GLIPIZIDE ER 5 MG PO TB24: 5.0000 mg | ORAL_TABLET | Freq: Every day | ORAL | Status: DC

## 2015-03-31 NOTE — Telephone Encounter (Signed)
Dr. Brigitte Pulse this pt caregiver [name] came asking for more coupons for Januvia.  However, we did not have any in the back hallway; however, we did print out some from online. She [name]) states that it is too much to afford and if possible could you give him something cheaper.  She may be reached at [phone].

## 2015-03-31 NOTE — Telephone Encounter (Signed)
Januvia d/c'd.  Start on gluctrol xl 5 qam.  Cont metformin XR 2000mg  qd.

## 2015-03-31 NOTE — Telephone Encounter (Signed)
Spoke with Mortimer Fries, advised message from Dr. Brigitte Pulse.

## 2015-03-31 NOTE — Telephone Encounter (Signed)
Needs to be seen in [facility] for any further refills - recommend scheduling now.

## 2015-04-06 ENCOUNTER — Other Ambulatory Visit: Payer: Self-pay | Admitting: Family Medicine

## 2015-04-23 ENCOUNTER — Other Ambulatory Visit: Payer: Self-pay | Admitting: Family Medicine

## 2015-05-07 ENCOUNTER — Other Ambulatory Visit: Payer: Self-pay | Admitting: Physician Assistant

## 2015-05-08 NOTE — Telephone Encounter (Signed)
Called pt and spoke to his daughter, caregiver, who advised that pt has plenty of meds until Dec and he plans to come in to see Dr Brigitte Pulse in Nov.

## 2015-05-21 ENCOUNTER — Ambulatory Visit (INDEPENDENT_AMBULATORY_CARE_PROVIDER_SITE_OTHER): Payer: Self-pay | Admitting: Family Medicine

## 2015-05-21 ENCOUNTER — Other Ambulatory Visit: Payer: Self-pay | Admitting: Family Medicine

## 2015-05-21 VITALS — BP 138/76 | HR 66 | Temp 98.5°F | Resp 20 | Ht 70.0 in | Wt 224.2 lb

## 2015-05-21 DIAGNOSIS — E041 Nontoxic single thyroid nodule: Secondary | ICD-10-CM

## 2015-05-21 DIAGNOSIS — Z0289 Encounter for other administrative examinations: Secondary | ICD-10-CM

## 2015-05-21 DIAGNOSIS — R319 Hematuria, unspecified: Secondary | ICD-10-CM

## 2015-05-21 DIAGNOSIS — Z021 Encounter for pre-employment examination: Secondary | ICD-10-CM

## 2015-05-21 NOTE — Progress Notes (Signed)
Chief Complaint:  Chief Complaint  Patient presents with  . DOT physical  . Medication Refill    refill of all meds today    HPI: Barry Gardner is a 64 y.o. male who reports to [facility] today complaining of DOT PE  He has no complaints He denies any  history of MI or OSA; he does have HTN and DM ; denies CP/SOB/palpitations He is compliant with his meds, he has no SEs He denies having SI/HI/hallucinations  Lab Results  Component Value Date   HGBA1C 7.3 12/30/2014   HGBA1C 12.2 05/20/2014   Lab Results  Component Value Date   MICROALBUR 1.3 05/20/2014   [place] [address] 12/30/2014   CREATININE 0.96 12/30/2014     Past Medical History  Diagnosis Date  . Diabetes mellitus without complication [facility])    Past Surgical History  Procedure Laterality Date  . Cholecystectomy    . Gallbladder surgery     Social History   Social History  . Marital Status: Single    Spouse Name: N/A  . Number of Children: N/A  . Years of Education: N/A   Social History Main Topics  . Smoking status: Never Smoker   . Smokeless tobacco: None  . Alcohol Use: No  . Drug Use: No  . Sexual Activity: Not Asked   Other Topics Concern  . None   Social History Narrative   Family History  Problem Relation Age of Onset  . Diabetes Mother   . Dementia Mother   . Asthma Mother   . Stroke Father   . Aneurysm Father   . Diabetes Brother    No Known Allergies Prior to Admission medications   Medication Sig Start Date End Date Taking? Authorizing Provider  Ascorbic Acid (VITAMIN C) 1000 MG tablet Take 1,000 mg by mouth daily.   Yes Historical Provider, MD  aspirin 81 MG tablet Take 81 mg by mouth daily.   Yes Historical Provider, MD  atenolol (TENORMIN) 100 MG tablet Take 1 tablet (100 mg total) by mouth daily. 12/30/14  Yes Shawnee Knapp, MD  b complex vitamins capsule Take 1 capsule by mouth daily.   Yes Historical Provider, MD  Cholecalciferol (VITAMIN D-3) 5000 UNITS TABS Take by mouth.    Yes Historical Provider, MD  glipiZIDE (GLUCOTROL XL) 5 MG 24 hr tablet Take 1 tablet (5 mg total) by mouth daily with breakfast. Needs to be seen in office for any additional refills 03/31/15  Yes Shawnee Knapp, MD  glucose monitoring kit (FREESTYLE) monitoring kit 1 each by Does not apply route as needed for other. 05/20/14  Yes Shawnee Knapp, MD  hydrochlorothiazide (HYDRODIURIL) 25 MG tablet Take 1 tablet (25 mg total) by mouth daily. 12/30/14  Yes Shawnee Knapp, MD  LYCOPENE PO Take by mouth daily.   Yes Historical Provider, MD  metFORMIN (GLUCOPHAGE-XR) 500 MG 24 hr tablet TAKE 4 TABLETS (2,000 MG TOTAL) BY MOUTH DAILY WITH BREAKFAST.  "NO MORE REFILLS WITHOUT OFFICE VISIT" 04/24/15  Yes Chelle Jeffery, PA-C  multivitamin-iron-minerals-folic acid (CENTRUM) chewable tablet Chew 1 tablet by mouth daily.   Yes Historical Provider, MD  Saw Palmetto, Serenoa repens, (SAW PALMETTO PO) Take by mouth 2 (two) times daily.   Yes Historical Provider, MD  tamsulosin (FLOMAX) 0.4 MG CAPS capsule Take 1 capsule (0.4 mg total) by mouth daily. 12/30/14  Yes Shawnee Knapp, MD  quinapril (ACCUPRIL) 40 MG tablet Take 1 tablet (40 mg total) by  mouth daily. Patient not taking: Reported on 05/21/2015 12/30/14   Shawnee Knapp, MD     ROS: The patient denies fevers, chills, night sweats, unintentional weight loss, chest pain, palpitations, wheezing, dyspnea on exertion, nausea, vomiting, abdominal pain, dysuria, hematuria, melena, numbness, weakness, or tingling.   All other systems have been reviewed and were otherwise negative with the exception of those mentioned in the HPI and as above.    PHYSICAL EXAM: Filed Vitals:   05/21/15 1829  BP: 138/76  Pulse: 66  Temp: 98.5 F (36.9 C)  Resp: 20   Body mass index is 32.18 kg/(m^2).   General: Alert, no acute distress HEENT:  Normocephalic, atraumatic, oropharynx patent. EOMI, PERRLA Fundo exam normal, + thyroid nodule on right side Cardiovascular:  Regular rate and rhythm,  no rubs murmurs or gallops.  No Carotid bruits, radial pulse intact. No pedal edema.  Respiratory: Clear to auscultation bilaterally.  No wheezes, rales, or rhonchi.  No cyanosis, no use of accessory musculature Abdominal: No organomegaly, abdomen is soft and non-tender, positive bowel sounds. No masses. Skin: No rashes. Neurologic: Facial musculature symmetric. Psychiatric: Patient acts appropriately throughout our interaction. Lymphatic: No cervical or submandibular lymphadenopathy Musculoskeletal: Gait intact. No edema, tenderness Full ROM neck 5/5 strength, 2/2 DTRs Neg hernia    LABS: Results for orders placed or performed in visit on 12/30/14  Vit D  25 hydroxy (rtn osteoporosis monitoring)  Result Value Ref Range   Vit D, 25-Hydroxy 54 30 - 100 ng/mL  Lipid panel  Result Value Ref Range   Cholesterol 158 0 - 200 mg/dL   Triglycerides 143 <150 mg/dL   HDL 36 (L) >=40 mg/dL   Total CHOL/HDL Ratio 4.4 Ratio   VLDL 29 0 - 40 mg/dL   LDL Cholesterol 93 0 - 99 mg/dL  Comprehensive metabolic panel  Result Value Ref Range   Sodium 141 135 - 145 mEq/L   Potassium 4.0 3.5 - 5.3 mEq/L   Chloride 98 96 - 112 mEq/L   CO2 30 19 - 32 mEq/L   Glucose, Bld 125 (H) 70 - 99 mg/dL   BUN 14 6 - 23 mg/dL   Creat 0.96 0.50 - 1.35 mg/dL   Total Bilirubin 0.5 0.2 - 1.2 mg/dL   Alkaline Phosphatase 38 (L) 39 - 117 U/L   AST 18 0 - 37 U/L   ALT 23 0 - 53 U/L   Total Protein 6.8 6.0 - 8.3 g/dL   Albumin 4.4 3.5 - 5.2 g/dL   Calcium 9.6 8.4 - 10.5 mg/dL  PSA  Result Value Ref Range   PSA 1.97 <=4.00 ng/mL  POCT glycosylated hemoglobin (Hb A1C)  Result Value Ref Range   Hemoglobin A1C 7.3      EKG/XRAY:   Primary read interpreted by Dr. Marin Comment at [facility].   ASSESSMENT/PLAN: Encounter Diagnoses  Name Primary?  . Health examination of defined subpopulation Yes  . Thyroid nodule   . Hematuria    He is scheduled to see Dr Brigitte Pulse soon He will ask her about her thyroid nodule since out of  scope of this DOT  PE He has a hx of HTN and DM , which was previoudly poorly controlled so I think my contribute to hematuria , he also has BPH and occasionally  has itching and would scratch his genital area but no rashes.  Fu  With Dr Brigitte Pulse.   Gross sideeffects, risk and benefits, and alternatives of medications d/w patient. Patient is aware that all  medications have potential sideeffects and we are unable to predict every sideeffect or drug-drug interaction that may occur.  Kerrianne Jeng DO  05/24/2015 9:52 AM

## 2015-06-01 ENCOUNTER — Other Ambulatory Visit: Payer: Self-pay | Admitting: Physician Assistant

## 2015-06-23 ENCOUNTER — Other Ambulatory Visit: Payer: Self-pay | Admitting: Family Medicine

## 2015-06-25 ENCOUNTER — Other Ambulatory Visit: Payer: Self-pay | Admitting: Family Medicine

## 2015-07-18 ENCOUNTER — Other Ambulatory Visit: Payer: Self-pay | Admitting: Family Medicine

## 2015-07-23 ENCOUNTER — Other Ambulatory Visit: Payer: Self-pay | Admitting: Family Medicine

## 2015-07-25 ENCOUNTER — Other Ambulatory Visit: Payer: Self-pay | Admitting: Family Medicine

## 2015-09-15 ENCOUNTER — Other Ambulatory Visit: Payer: Self-pay | Admitting: Family Medicine

## 2015-09-16 ENCOUNTER — Telehealth: Payer: Self-pay | Admitting: *Deleted

## 2015-09-16 NOTE — Telephone Encounter (Signed)
Spoke with daughter she states 4 deaths in family and will get her dad in 48 month.  Patient understood needs a follow up for anymore refills

## 2015-09-20 ENCOUNTER — Other Ambulatory Visit: Payer: Self-pay | Admitting: Family Medicine

## 2015-11-11 ENCOUNTER — Other Ambulatory Visit: Payer: Self-pay | Admitting: Family Medicine

## 2015-11-14 ENCOUNTER — Other Ambulatory Visit: Payer: Self-pay | Admitting: Family Medicine

## 2015-12-21 ENCOUNTER — Other Ambulatory Visit: Payer: Self-pay | Admitting: Family Medicine

## 2016-01-17 ENCOUNTER — Other Ambulatory Visit: Payer: Self-pay | Admitting: Physician Assistant

## 2016-01-31 ENCOUNTER — Other Ambulatory Visit: Payer: Self-pay | Admitting: Family Medicine

## 2016-05-20 ENCOUNTER — Encounter: Payer: Self-pay | Admitting: Family Medicine

## 2016-05-20 ENCOUNTER — Ambulatory Visit (INDEPENDENT_AMBULATORY_CARE_PROVIDER_SITE_OTHER): Payer: BLUE CROSS/BLUE SHIELD | Admitting: Family Medicine

## 2016-05-20 ENCOUNTER — Ambulatory Visit (INDEPENDENT_AMBULATORY_CARE_PROVIDER_SITE_OTHER): Payer: Self-pay | Admitting: Family Medicine

## 2016-05-20 ENCOUNTER — Ambulatory Visit: Payer: Self-pay

## 2016-05-20 ENCOUNTER — Ambulatory Visit: Payer: BLUE CROSS/BLUE SHIELD | Admitting: Family Medicine

## 2016-05-20 VITALS — BP 124/80 | HR 63 | Temp 98.4°F | Resp 18 | Ht 70.0 in | Wt 216.0 lb

## 2016-05-20 VITALS — BP 124/80 | HR 68 | Temp 98.4°F | Resp 18 | Ht 70.0 in | Wt 216.0 lb

## 2016-05-20 DIAGNOSIS — E1165 Type 2 diabetes mellitus with hyperglycemia: Secondary | ICD-10-CM

## 2016-05-20 DIAGNOSIS — Z024 Encounter for examination for driving license: Secondary | ICD-10-CM

## 2016-05-20 LAB — POCT GLYCOSYLATED HEMOGLOBIN (HGB A1C): HEMOGLOBIN A1C: 11.9

## 2016-05-20 MED ORDER — TAMSULOSIN HCL 0.4 MG PO CAPS: ORAL_CAPSULE | ORAL | 1 refills | Status: DC

## 2016-05-20 MED ORDER — ATENOLOL 100 MG PO TABS: 100.0000 mg | ORAL_TABLET | Freq: Every day | ORAL | 1 refills | Status: DC

## 2016-05-20 MED ORDER — QUINAPRIL HCL 40 MG PO TABS
40.0000 mg | ORAL_TABLET | Freq: Every day | ORAL | 1 refills | Status: DC
Start: 2016-05-20 — End: 2016-11-20

## 2016-05-20 MED ORDER — METFORMIN HCL ER 500 MG PO TB24: ORAL_TABLET | ORAL | 1 refills | Status: DC

## 2016-05-20 MED ORDER — GLIPIZIDE ER 5 MG PO TB24: 5.0000 mg | ORAL_TABLET | Freq: Every day | ORAL | 1 refills | Status: DC

## 2016-05-20 MED ORDER — HYDROCHLOROTHIAZIDE 25 MG PO TABS: ORAL_TABLET | ORAL | 1 refills | Status: DC

## 2016-05-20 NOTE — Progress Notes (Signed)
Games developer Medical Examination   Barry Gardner is a 65 y.o. male who presents today for a commercial driver fitness determination physical exam. The patient reports no problems. The following portions of the patient's history were reviewed and updated as appropriate: allergies, current medications, past family history, past medical history, past social history, past surgical history and problem list. Review of Systems A comprehensive review of systems was negative.   Objective:    Vision:  Applicant can recognize and distinguish among traffic control signals and devices showing standard red, green, and amber colors.     Monocular Vision?: No    Visual Acuity Screening   Right eye Left eye Both eyes  Without correction: 20/25 20/30-1 20/25  With correction:      Hearing: Hearing Screening Comments: Whisper test is 40ft in both eyes.      BP 124/80 (BP Location: Left Arm, Patient Position: Sitting, Cuff Size: Small)   Pulse 63   Temp 98.4 F (36.9 C) (Oral)   Resp 18   Ht 5\' 10"  (1.778 m)   Wt 216 lb (98 kg)   SpO2 98%   BMI 30.99 kg/m   General Appearance:    Alert, cooperative, no distress, appears stated age, poor hygiene  Head:    Normocephalic, without obvious abnormality, atraumatic  Eyes:    PERRL, conjunctiva/corneas clear, EOM's intact, fundi    benign, both eyes       Ears:    Normal TM's and external ear canals left ears, cerumen impaction in Rt  Nose:   Nares normal, septum midline, mucosa normal, no drainage    or sinus tenderness  Throat:   Lips, mucosa, and tongue normal; teeth and gums normal  Neck:   Supple, symmetrical, trachea midline;       thyroid:  No tenderness; right 4 cm fixed firm nodule on right lower ant cervical change  Back:     Symmetric, no curvature, ROM normal, no CVA tenderness  Lungs:     Clear to auscultation bilaterally, respirations unlabored  Chest wall:    No tenderness or deformity  Heart:    Regular rate and rhythm,  S1 and S2 normal, no murmur, rub   or gallop  Abdomen:     Soft, non-tender, bowel sounds active all four quadrants,    no masses, no organomegaly, + diastasis recti        Extremities:   Extremities normal, atraumatic, no cyanosis or edema  Pulses:   2+ and symmetric all extremities  Skin:   Skin color, texture, turgor normal, no rashes or lesions  Lymph nodes:   Cervical, supraclavicular, and axillary nodes normal  Neurologic:   CNII-XII intact. Normal strength, sensation and reflexes      throughout     Labs:No results found for: SPECGRAV, PROTEINUR, BILIRUBINUR, GLUCOSEU  SG 1.030, neg prot, neg blood, neg sugar   Results for orders placed or performed in visit on 05/20/16  POCT glycosylated hemoglobin (Hb A1C)  Result Value Ref Range   Hemoglobin A1C 11.9      Assessment:    Healthy male exam.  Meets standards, but periodic monitoring required due to DMII, HTN.  Driver qualified only for 6 months.    Plan:    Medical examiners certificate completed and printed. Return as needed.  Pt DM is uncontrolled due to poor med compliance. Restart meds, recheck with PCP in 4 mos and if a1c sig improved - rec <8 - will then addend todays DOT  to change to a 1 yr card Needs visual fields at next OV

## 2016-05-20 NOTE — Progress Notes (Signed)
Subjective:    Patient ID: Barry Gardner, male    DOB: July 18, 1950, 65 y.o.   MRN: [id number] Chief Complaint  Patient presents with  . Medication Refill    All meds    HPI  Mr. Barry Gardner is a 65 yo male who is here today to follow-up on his chronic medical problems.    He has had a very difficult year.  In February his son was killed at the beginning of the road to the pt's house which he has to pass every day. They then had 9 people deceased in his family so he went off of all of his medicines for a long time.  His daughter who accompanies him to his visit today.  Depression screen [facility] 2/9 05/20/2016 05/20/2016 05/21/2015 12/30/2014  Decreased Interest 0 0 0 0  Down, Depressed, Hopeless 0 0 0 0  PHQ - 2 Score 0 0 0 0   Past Medical History:  Diagnosis Date  . Diabetes mellitus without complication [facility])    Past Surgical History:  Procedure Laterality Date  . CHOLECYSTECTOMY    . GALLBLADDER SURGERY     Current Outpatient Prescriptions on File Prior to Visit  Medication Sig Dispense Refill  . Ascorbic Acid (VITAMIN C) 1000 MG tablet Take 1,000 mg by mouth daily.    Marland Kitchen aspirin 81 MG tablet Take 81 mg by mouth daily.    Marland Kitchen b complex vitamins capsule Take 1 capsule by mouth daily.    . Cholecalciferol (VITAMIN D-3) 5000 UNITS TABS Take by mouth.    Marland Kitchen LYCOPENE PO Take by mouth daily.    . multivitamin-iron-minerals-folic acid (CENTRUM) chewable tablet Chew 1 tablet by mouth daily.    . Saw Palmetto, Serenoa repens, (SAW PALMETTO PO) Take by mouth 2 (two) times daily.     No current facility-administered medications on file prior to visit.    No Known Allergies Family History  Problem Relation Age of Onset  . Diabetes Mother   . Dementia Mother   . Asthma Mother   . Stroke Father   . Aneurysm Father   . Diabetes Brother    Social History   Social History  . Marital status: Single    Spouse name: N/A  . Number of children: N/A  . Years of education: N/A   Social History  Main Topics  . Smoking status: Never Smoker  . Smokeless tobacco: Never Used  . Alcohol use No  . Drug use: No  . Sexual activity: Not Asked   Other Topics Concern  . None   Social History Narrative  . None      Review of Systems  Constitutional: Negative for chills and fever.  Eyes: Negative for visual disturbance.  Respiratory: Negative for shortness of breath.   Cardiovascular: Negative for chest pain and leg swelling.  Neurological: Negative for dizziness, syncope, facial asymmetry, weakness, light-headedness and headaches.  Psychiatric/Behavioral: Negative for dysphoric mood and sleep disturbance. The patient is not nervous/anxious.    See hpi    Objective:   Physical Exam  Constitutional: He is oriented to person, place, and time. He appears well-developed. No distress.  Poor personal hygeine - + body odor, clothes torn and stained  HENT:  Head: Normocephalic and atraumatic.  Eyes: Conjunctivae are normal. Pupils are equal, round, and reactive to light. No scleral icterus.  Neck: Normal range of motion. Neck supple. No tracheal tenderness, no spinous process tenderness and no muscular tenderness present. No tracheal deviation, no erythema  and normal range of motion present. Thyroid mass present. No thyromegaly present.  4 cm mass in Rt neck - adenopathy vs thyroid - pt states it has been present and unchanged for numerous years, not bothering him at at all  Cardiovascular: Normal rate, regular rhythm, normal heart sounds and intact distal pulses.   Pulmonary/Chest: Effort normal and breath sounds normal. No respiratory distress.  Musculoskeletal: He exhibits no edema.  Lymphadenopathy:    He has no cervical adenopathy.  Neurological: He is alert and oriented to person, place, and time.  Skin: Skin is warm and dry. He is not diaphoretic.  Psychiatric: He has a normal mood and affect. His behavior is normal.    BP 124/80 (BP Location: Right Arm, Patient Position:  Sitting, Cuff Size: Small)   Pulse 68   Temp 98.4 F (36.9 C) (Oral)   Resp 18   Ht 5\' 10"  (1.778 m)   Wt 216 lb (98 kg)   SpO2 98%   BMI 30.99 kg/m   4 cm mass in neck Need to do visual feilds at f/u  BP 124/80 (BP Location: Right Arm, Patient Position: Sitting, Cuff Size: Small)   Pulse 68   Temp 98.4 F (36.9 C) (Oral)   Resp 18   Ht 5\' 10"  (1.778 m)   Wt 216 lb (98 kg)   SpO2 98%   BMI 30.99 kg/m     Results for orders placed or performed in visit on 05/20/16  POCT glycosylated hemoglobin (Hb A1C)  Result Value Ref Range   Hemoglobin A1C 11.9        Assessment & Plan:  Change rxs to 90d at [facility]/ Needs cmp, a1c at f/u  1. Uncontrolled type 2 diabetes mellitus with hyperglycemia, without long-term current use of insulin (HCC)   Pt presented today for his DOT exam. He is accompanied by his daughter who provides most of the history and helps him with his medical care (fills rxs, fills medbox, brings him to all appts, etc). He has been non-compliant with his medical care over the past year and off most of his meds for the past 3-6 mos - not sure what he is taking and what not, nor if he has been taking meds as rx'ed/compliance. Restart prior regimen. Pt has a VERY poor health (and likely general) literacy which makes adequate control and appropriate f/u challenging. I do not recall seeing neck mass but both pt and daughter state it is chronic and decline further eval of it at this point but recheck in 3 mos and I do think he will need to proceed with imaging likely followed by biopsy vs referral at that ime  Orders Placed This Encounter  Procedures  . POCT glycosylated hemoglobin (Hb A1C)    Meds ordered this encounter  Medications  . glipiZIDE (GLIPIZIDE XL) 5 MG 24 hr tablet    Sig: Take 1 tablet (5 mg total) by mouth daily with breakfast.    Dispense:  90 tablet    Refill:  1  . metFORMIN (GLUCOPHAGE-XR) 500 MG 24 hr tablet    Sig: TAKE 4 TABLETS  (2,000 MG TOTAL) BY MOUTH DAILY WITH BREAKFAST.    Dispense:  360 tablet    Refill:  1  . quinapril (ACCUPRIL) 40 MG tablet    Sig: Take 1 tablet (40 mg total) by mouth daily.    Dispense:  90 tablet    Refill:  1  . tamsulosin (FLOMAX) 0.4 MG CAPS capsule  Sig: TAKE 1 CAPSULE (0.4 MG TOTAL) BY MOUTH DAILY.    Dispense:  90 capsule    Refill:  1  . atenolol (TENORMIN) 100 MG tablet    Sig: Take 1 tablet (100 mg total) by mouth daily.    Dispense:  90 tablet    Refill:  1  . hydrochlorothiazide (HYDRODIURIL) 25 MG tablet    Sig: TAKE 1 TABLET (25 MG TOTAL) BY MOUTH DAILY    Dispense:  90 tablet    Refill:  1    [name], M.D.  [facility] [address] [place], [place] [address] [phone] phone [phone] fax  05/20/16 11:56 PM

## 2016-05-20 NOTE — Patient Instructions (Signed)
     IF you received an x-ray today, you will receive an invoice from [facility]. Please contact [facility] at [phone] with questions or concerns regarding your invoice.   IF you received labwork today, you will receive an invoice from [facility]. Please contact [facility] at [phone] with questions or concerns regarding your invoice.   Our billing staff will not be able to assist you with questions regarding bills from these companies.  You will be contacted with the lab results as soon as they are available. The fastest way to get your results is to activate your My Chart account. Instructions are located on the last page of this paperwork. If you have not heard from us regarding the results in 2 weeks, please contact this office.      

## 2016-05-20 NOTE — Patient Instructions (Signed)
     IF you received an x-ray today, you will receive an invoice from [facility]. Please contact  Radiology at [phone] with questions or concerns regarding your invoice.   IF you received labwork today, you will receive an invoice from [facility]. Please contact [facility] at [phone] with questions or concerns regarding your invoice.   Our billing staff will not be able to assist you with questions regarding bills from these companies.  You will be contacted with the lab results as soon as they are available. The fastest way to get your results is to activate your My Chart account. Instructions are located on the last page of this paperwork. If you have not heard from us regarding the results in 2 weeks, please contact this office.      

## 2016-11-20 ENCOUNTER — Telehealth: Payer: Self-pay | Admitting: Family Medicine

## 2016-11-20 ENCOUNTER — Ambulatory Visit (INDEPENDENT_AMBULATORY_CARE_PROVIDER_SITE_OTHER): Payer: BLUE CROSS/BLUE SHIELD | Admitting: Family Medicine

## 2016-11-20 ENCOUNTER — Encounter: Payer: Self-pay | Admitting: Family Medicine

## 2016-11-20 VITALS — BP 161/83 | HR 64 | Temp 98.6°F | Resp 17 | Ht 70.0 in | Wt 222.2 lb

## 2016-11-20 DIAGNOSIS — E1165 Type 2 diabetes mellitus with hyperglycemia: Secondary | ICD-10-CM

## 2016-11-20 DIAGNOSIS — Z125 Encounter for screening for malignant neoplasm of prostate: Secondary | ICD-10-CM | POA: Diagnosis not present

## 2016-11-20 DIAGNOSIS — I10 Essential (primary) hypertension: Secondary | ICD-10-CM

## 2016-11-20 LAB — POCT URINALYSIS DIP (MANUAL ENTRY)
BILIRUBIN UA: NEGATIVE
Blood, UA: NEGATIVE
GLUCOSE UA: NEGATIVE mg/dL
Ketones, POC UA: NEGATIVE mg/dL
NITRITE UA: NEGATIVE
PH UA: 5 (ref 5.0–8.0)
Protein Ur, POC: NEGATIVE mg/dL
Spec Grav, UA: 1.02 (ref 1.010–1.025)
UROBILINOGEN UA: 0.2 U/dL

## 2016-11-20 LAB — COMPREHENSIVE METABOLIC PANEL
ALBUMIN: 4.4 g/dL (ref 3.6–5.1)
ALT: 20 U/L (ref 9–46)
AST: 19 U/L (ref 10–35)
Alkaline Phosphatase: 45 U/L (ref 40–115)
BILIRUBIN TOTAL: 0.4 mg/dL (ref 0.2–1.2)
BUN: 17 mg/dL (ref 7–25)
CHLORIDE: 99 mmol/L (ref 98–110)
CO2: 28 mmol/L (ref 20–31)
CREATININE: 1.07 mg/dL (ref 0.70–1.25)
Calcium: 9.8 mg/dL (ref 8.6–10.3)
Glucose, Bld: 110 mg/dL — ABNORMAL HIGH (ref 65–99)
Potassium: 3.9 mmol/L (ref 3.5–5.3)
SODIUM: 138 mmol/L (ref 135–146)
TOTAL PROTEIN: 6.7 g/dL (ref 6.1–8.1)

## 2016-11-20 LAB — POCT GLYCOSYLATED HEMOGLOBIN (HGB A1C): Hemoglobin A1C: 8.6

## 2016-11-20 MED ORDER — GLUCOSE BLOOD VI STRP: ORAL_STRIP | 3 refills | Status: DC

## 2016-11-20 MED ORDER — BAYER MICROLET LANCETS MISC: 3 refills | Status: DC

## 2016-11-20 MED ORDER — METFORMIN HCL ER 500 MG PO TB24: ORAL_TABLET | ORAL | 1 refills | Status: DC

## 2016-11-20 MED ORDER — ATENOLOL-CHLORTHALIDONE 100-25 MG PO TABS: 1.0000 | ORAL_TABLET | Freq: Every day | ORAL | 1 refills | Status: DC

## 2016-11-20 MED ORDER — GLIPIZIDE ER 10 MG PO TB24: 10.0000 mg | ORAL_TABLET | Freq: Every day | ORAL | 1 refills | Status: DC

## 2016-11-20 MED ORDER — QUINAPRIL HCL 40 MG PO TABS: 40.0000 mg | ORAL_TABLET | Freq: Every day | ORAL | 1 refills | Status: DC

## 2016-11-20 NOTE — Telephone Encounter (Signed)
Pt was just seen and given a contour meter but insurance does not cover test strips so he needs test strips and meter for free style   Please call [name] when this has been done   Best number [phone]

## 2016-11-20 NOTE — Progress Notes (Signed)
Subjective:  By signing my name below, I, Barry Gardner, attest that this documentation has been prepared under the direction and in the presence of Barry Cheadle, MD Electronically Signed: Ladene Gardner, ED Scribe 11/20/2016 at 3:49 PM.   Patient ID: Barry Gardner, male    DOB: 1950/10/20, 66 y.o.   MRN: [id number]  Chief Complaint  Patient presents with  . Follow-up    Diabetic f/u   HPI Barry Gardner is a 66 y.o. male who presents to Primary Care at [facility] for a diabetic follow-up. Pt denies side effects with BP medications. He does not check his blood glucose or BP outside of the office. Pt does not have a monitor at home. He denies dizziness, light-headedness, numbness in his lower extremities, rashes, sores, difficulty urinating. Pt denies tobacco and alcohol use.   Past Medical History:  Diagnosis Date  . Diabetes mellitus without complication [facility])    Current Outpatient Prescriptions on File Prior to Visit  Medication Sig Dispense Refill  . Ascorbic Acid (VITAMIN C) 1000 MG tablet Take 1,000 mg by mouth daily.    Marland Kitchen aspirin 81 MG tablet Take 81 mg by mouth daily.    Marland Kitchen atenolol (TENORMIN) 100 MG tablet Take 1 tablet (100 mg total) by mouth daily. 90 tablet 1  . b complex vitamins capsule Take 1 capsule by mouth daily.    . Cholecalciferol (VITAMIN D-3) 5000 UNITS TABS Take by mouth.    Marland Kitchen glipiZIDE (GLIPIZIDE XL) 5 MG 24 hr tablet Take 1 tablet (5 mg total) by mouth daily with breakfast. 90 tablet 1  . hydrochlorothiazide (HYDRODIURIL) 25 MG tablet TAKE 1 TABLET (25 MG TOTAL) BY MOUTH DAILY 90 tablet 1  . LYCOPENE PO Take by mouth daily.    . metFORMIN (GLUCOPHAGE-XR) 500 MG 24 hr tablet TAKE 4 TABLETS (2,000 MG TOTAL) BY MOUTH DAILY WITH BREAKFAST. 360 tablet 1  . multivitamin-iron-minerals-folic acid (CENTRUM) chewable tablet Chew 1 tablet by mouth daily.    . quinapril (ACCUPRIL) 40 MG tablet Take 1 tablet (40 mg total) by mouth daily. 90 tablet 1  . Saw Palmetto, [place]  repens, (SAW PALMETTO PO) Take by mouth 2 (two) times daily.    . tamsulosin (FLOMAX) 0.4 MG CAPS capsule TAKE 1 CAPSULE (0.4 MG TOTAL) BY MOUTH DAILY. 90 capsule 1   No current facility-administered medications on file prior to visit.    No Known Allergies   Past Surgical History:  Procedure Laterality Date  . CHOLECYSTECTOMY    . GALLBLADDER SURGERY     Family History  Problem Relation Age of Onset  . Diabetes Mother   . Dementia Mother   . Asthma Mother   . Stroke Father   . Aneurysm Father   . Diabetes Brother    Social History   Social History  . Marital status: Single    Spouse name: N/A  . Number of children: N/A  . Years of education: N/A   Social History Main Topics  . Smoking status: Never Smoker  . Smokeless tobacco: Never Used  . Alcohol use No  . Drug use: No  . Sexual activity: Not Asked   Other Topics Concern  . None   Social History Narrative  . None   Depression screen [facility] 2/9 11/20/2016 05/20/2016 05/20/2016 05/21/2015 12/30/2014  Decreased Interest 0 0 0 0 0  Down, Depressed, Hopeless 0 0 0 0 0  PHQ - 2 Score 0 0 0 0 0    Review of  Systems  Constitutional: Positive for fatigue. Negative for activity change, appetite change and fever.  Cardiovascular: Negative for leg swelling.  Gastrointestinal: Negative for nausea and vomiting.  Genitourinary: Negative for difficulty urinating.  Skin: Negative for rash and wound.  Neurological: Negative for dizziness, weakness, light-headedness and numbness.      Objective:   Physical Exam  Constitutional: He is oriented to person, place, and time. He appears well-developed and well-nourished. No distress.  HENT:  Head: Normocephalic and atraumatic.  Eyes: Conjunctivae and EOM are normal.  Neck: Neck supple. No tracheal deviation present.  Cardiovascular: Normal rate.   Pulmonary/Chest: Effort normal. No respiratory distress.  Musculoskeletal: Normal range of motion.  Neurological: He is alert and  oriented to person, place, and time.  Skin: Skin is warm and dry.  Psychiatric: He has a normal mood and affect. His behavior is normal.  Nursing note and vitals reviewed.  BP (!) 178/86   Pulse 64   Temp 98.6 F (37 C) (Oral)   Resp 17   Ht 5\' 10"  (1.778 m)   Wt 222 lb 4 oz (100.8 kg)   SpO2 97%   BMI 31.89 kg/m  Repeat BP: 160/74  End of visit: repeat BP 161/83    Results for orders placed or performed in visit on 11/20/16  POCT glycosylated hemoglobin (Hb A1C)  Result Value Ref Range   Hemoglobin A1C 8.6   POCT urinalysis dipstick  Result Value Ref Range   Color, UA yellow yellow   Clarity, UA clear clear   Glucose, UA negative negative mg/dL   Bilirubin, UA negative negative   Ketones, POC UA negative negative mg/dL   Spec Grav, UA 1.020 1.010 - 1.025   Blood, UA negative negative   pH, UA 5.0 5.0 - 8.0   Protein Ur, POC negative negative mg/dL   Urobilinogen, UA 0.2 0.2 or 1.0 E.U./dL   Nitrite, UA Negative Negative   Leukocytes, UA Trace (A) Negative   Assessment & Plan:   1. Uncontrolled type 2 diabetes mellitus with hyperglycemia, without long-term current use of insulin (HCC) - dramatically improved 11.9->8.6 today, cont metformin XR 1g bid, increase glucotrol xl from 5->10mg  as no hypoglycemic episodes.  Need prevnar-13 at f/u. Need tdap at next visit (esp as he is not yet on medicare). Asa? Need optho referral? Given Contour glucometer in office today and taught how to use by Butch Penny CMA, strips/lancets sent.  2. Essential hypertension, benign - cont benazepril 40, will stop hctz 25 and atenolol 100 and start atenolol-chlorthalidone 100-25 instead - pt refuses to take any more # of pills - therefore if he is tolerating the new combo pill and bp still elev, we can add in amlodipine.  3. 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 If he does return for a DOT exam at some point in the future, I request that he see another provider for this since I have a conflict of interest as his PCP.  Sched for CPE with BP check 1 mo F/u DM 4 mos.  Orders Placed This Encounter  Procedures  . PSA  . Comprehensive metabolic  panel    Order Specific Question:   Has the patient fasted?    Answer:   Yes  . Microalbumin/Creatinine Ratio, Urine  . Care order/instruction:    Scheduling Instructions:     Recheck BP  . Care order/instruction:    Scheduling Instructions:     Complete orders, AVS and go.  Marland Kitchen POCT glycosylated hemoglobin (Hb A1C)  . POCT urinalysis dipstick    Meds ordered this encounter  Medications  . atenolol-chlorthalidone (TENORETIC) 100-25 MG tablet    Sig: Take 1 tablet by mouth daily.    Dispense:  90 tablet    Refill:  1  . glipiZIDE (GLUCOTROL XL) 10 MG 24 hr tablet    Sig: Take 1 tablet (10 mg total) by mouth daily with breakfast.    Dispense:  90 tablet    Refill:  1  . metFORMIN (GLUCOPHAGE-XR) 500 MG 24 hr tablet    Sig: TAKE 4 TABLETS (2,000 MG TOTAL) BY MOUTH DAILY WITH BREAKFAST.    Dispense:  360 tablet    Refill:  1  . quinapril (ACCUPRIL) 40 MG tablet    Sig: Take 1 tablet (40 mg total) by mouth daily.    Dispense:  90 tablet    Refill:  1  . glucose blood (CONTOUR NEXT TEST) test strip    Sig: Use to check blood sugar daily. Dx: E11.65    Dispense:  100 each    Refill:  3  . BAYER MICROLET LANCETS lancets    Sig: Use to check blood sugar daily.  Dx: E11.65    Dispense:  100 each    Refill:  3    I personally performed the services described in this documentation, which was scribed in my presence. The recorded information has been reviewed and considered, and addended by me as needed.   Barry Gardner, M.D.  Primary Care at  [facility] [address] [place], [place] [address] [phone] phone [phone] fax  11/20/16 9:58 PM

## 2016-11-20 NOTE — Patient Instructions (Addendum)
Stop the atenolol 100mg  and hctz 25mg  and start atenolol-chlorthalidone 100-25mg .  Make sure you are drinking plenty of fluids. I have increased the Glucotrol XL so take 2 tabs of your current 5 mg dose until you run out and then start 1 tab a day of the new Glucotrol XL 10 mg prescription.  To keep your DOT card you HAVE to have a blood pressure <140/90 and your diabetes under good control which most providers think a reasonable requirement is to keep your hemoglobin A1c <8.0 although medically the goal is to keep your hemoglobin a1c <7.0.   Your hemoglobin A1c today was 8.6 which is dramatically improved from prior at 11.9 so amazing job and taking your medication every day.  Hopefully with some additional tweaks we will continue to see improvement.   Since her blood pressure is so high today, I would recommend that you recheck with me in 4-6 weeks to ensure BP has come down with the new medication. If you wanted to make this appointment for your full physical (CPE), there will not be a co-pay and we will have extra time to admit dress other issues to ensure that other common problems aren't brewing that we are overlooking and help make sure that we are doing our best to help keep you healthy.  For this for physical make a morning time appointment so that you can be fasting for at least 8 hours beforehand. Normally we request that you do not eat or drink anything other than water black coffee after midnight the evening prior.   You will be due for another recheck of your diabetes in 4 to 6 months.   IF you received an x-ray today, you will receive an invoice from [facility]. Please contact [facility] at [phone] with questions or concerns regarding your invoice.   IF you received labwork today, you will receive an invoice from [facility]. Please contact [facility] at [phone] with questions or concerns regarding your invoice.   Our billing staff will not be able to assist you  with questions regarding bills from these companies.  You will be contacted with the lab results as soon as they are available. The fastest way to get your results is to activate your My Chart account. Instructions are located on the last page of this paperwork. If you have not heard from [place] regarding the results in 2 weeks, please contact this office.     Blood Glucose Monitoring, Adult Monitoring your blood sugar (glucose) helps you manage your diabetes. It also helps you and your health care provider determine how well your diabetes management plan is working. Blood glucose monitoring involves checking your blood glucose as often as directed, and keeping a record (log) of your results over time. Why should I monitor my blood glucose? Checking your blood glucose regularly can:  Help you understand how food, exercise, illnesses, and medicines affect your blood glucose.  Let you know what your blood glucose is at any time. You can quickly tell if you are having low blood glucose (hypoglycemia) or high blood glucose (hyperglycemia).  Help you and your health care provider adjust your medicines as needed. When should I check my blood glucose? Follow instructions from your health care provider about how often to check your blood glucose. This may depend on:  The type of diabetes you have.  How well-controlled your diabetes is.  Medicines you are taking. If you have type 1 diabetes:   Check your blood glucose at least  2 times a day.  Also check your blood glucose:  Before every insulin injection.  Before and after exercise.  Between meals.  2 hours after a meal.  Occasionally between 2:00 a.m. and 3:00 a.m., as directed.  Before potentially dangerous tasks, like driving or using heavy machinery.  At bedtime.  You may need to check your blood glucose more often, up to 6-10 times a day:  If you use an insulin pump.  If you need multiple daily injections (MDI).  If your  diabetes is not well-controlled.  If you are ill.  If you have a history of severe hypoglycemia.  If you have a history of not knowing when your blood glucose is getting low (hypoglycemia unawareness). If you have type 2 diabetes:   If you take insulin or other diabetes medicines, check your blood glucose at least 2 times a day.  If you are on intensive insulin therapy, check your blood glucose at least 4 times a day. Occasionally, you may also need to check between 2:00 a.m. and 3:00 a.m., as directed.  Also check your blood glucose:  Before and after exercise.  Before potentially dangerous tasks, like driving or using heavy machinery.  You may need to check your blood glucose more often if:  Your medicine is being adjusted.  Your diabetes is not well-controlled.  You are ill. What is a blood glucose log?  A blood glucose log is a record of your blood glucose readings. It helps you and your health care provider:  Look for patterns in your blood glucose over time.  Adjust your diabetes management plan as needed.  Every time you check your blood glucose, write down your result and notes about things that may be affecting your blood glucose, such as your diet and exercise for the day.  Most glucose meters store a record of glucose readings in the meter. Some meters allow you to download your records to a computer. How do I check my blood glucose? Follow these steps to get accurate readings of your blood glucose: Supplies needed    Blood glucose meter.  Test strips for your meter. Each meter has its own strips. You must use the strips that come with your meter.  A needle to prick your finger (lancet). Do not use lancets more than once.  A device that holds the lancet (lancing device).  A journal or log book to write down your results. Procedure   Wash your hands with soap and water.  Prick the side of your finger (not the tip) with the lancet. Use a different  finger each time.  Gently rub the finger until a small drop of blood appears.  Follow instructions that come with your meter for inserting the test strip, applying blood to the strip, and using your blood glucose meter.  Write down your result and any notes. Alternative testing sites   Some meters allow you to use areas of your body other than your finger (alternative sites) to test your blood.  If you think you may have hypoglycemia, or if you have hypoglycemia unawareness, do not use alternative sites. Use your finger instead.  Alternative sites may not be as accurate as the fingers, because blood flow is slower in these areas. This means that the result you get may be delayed, and it may be different from the result that you would get from your finger.  The most common alternative sites are:  Forearm.  Thigh.  Palm of the hand.  Additional tips   Always keep your supplies with you.  If you have questions or need help, all blood glucose meters have a 24-hour "hotline" number that you can call. You may also contact your health care provider.  After you use a few boxes of test strips, adjust (calibrate) your blood glucose meter by following instructions that came with your meter. This information is not intended to replace advice given to you by your health care provider. Make sure you discuss any questions you have with your health care provider. Document Released: 07/01/2003 Document Revised: 01/16/2016 Document Reviewed: 12/08/2015 Elsevier Interactive Patient Education  2017 [employer].  Managing Your Hypertension Hypertension is commonly called high blood pressure. This is when the force of your blood pressing against the walls of your arteries is too strong. Arteries are blood vessels that carry blood from your heart throughout your body. Hypertension forces the heart to work harder to pump blood, and may cause the arteries to become narrow or stiff. Having untreated or  uncontrolled hypertension can cause heart attack, stroke, kidney disease, and other problems. What are blood pressure readings? A blood pressure reading consists of a higher number over a lower number. Ideally, your blood pressure should be below 120/80. The first ("top") number is called the systolic pressure. It is a measure of the pressure in your arteries as your heart beats. The second ("bottom") number is called the diastolic pressure. It is a measure of the pressure in your arteries as the heart relaxes. What does my blood pressure reading mean? Blood pressure is classified into four stages. Based on your blood pressure reading, your health care provider may use the following stages to determine what type of treatment you need, if any. Systolic pressure and diastolic pressure are measured in a unit called mm Hg. Normal   Systolic pressure: below 213.  Diastolic pressure: below 80. Elevated   Systolic pressure: 086-578.  Diastolic pressure: below 80. Hypertension stage 1     Diastolic pressure: 46-96. Hypertension stage 2   Systolic pressure: 295 or above.  Diastolic pressure: 90 or above. What health risks are associated with hypertension? Managing your hypertension is an important responsibility. Uncontrolled hypertension can lead to:  A heart attack.  A stroke.  A weakened blood vessel (aneurysm).  Heart failure.  Kidney damage.  Eye damage.  Metabolic syndrome.  Memory and concentration problems. What changes can I make to manage my hypertension? Eating and drinking   Eat a diet that is high in fiber and potassium, and low in salt (sodium), added sugar, and fat. An example eating plan is called the DASH (Dietary Approaches to Stop Hypertension) diet. To eat this way:  Eat plenty of fresh fruits and vegetables. Try to fill half of your plate at each meal with fruits and vegetables.  Eat whole grains, such as whole wheat pasta, brown rice, or whole grain  bread. Fill about one quarter of your plate with whole grains.  Eat low-fat diary products.  Avoid fatty cuts of meat, processed or cured meats, and poultry with skin. Fill about one quarter of your plate with lean proteins such as fish, chicken without skin, beans, eggs, and tofu.  Avoid premade and processed foods. These tend to be higher in sodium, added sugar, and fat.     Lifestyle   Work with your health care provider to maintain a healthy body weight, or to lose weight. Ask what an ideal weight is for you.  Get at least 30  minutes of exercise that causes your heart to beat faster (aerobic exercise) most days of the week. Activities may include walking, swimming, or biking.       Monitoring   Monitor your blood pressure at home as told by your health care provider. Your personal target blood pressure may vary depending on your medical conditions, your age, and other factors.  Have your blood pressure checked regularly, as often as told by your health care provider. Working with your health care provider   Review all the medicines you take with your health care provider because there may be side effects or interactions.  Talk with your health care provider about your diet, exercise habits, and other lifestyle factors that may be contributing to hypertension.  Visit your health care provider regularly. Your health care provider can help you create and adjust your plan for managing hypertension. Will I need medicine to control my blood pressure? Your health care provider may prescribe medicine if lifestyle changes are not enough to get your blood pressure under control, and if:  Your systolic blood pressure is 130 or higher.  Your diastolic blood pressure is 80 or higher. Take medicines only as told by your health care provider. Follow the directions carefully. Blood pressure medicines must be taken as prescribed. The medicine does not work as well when you skip doses.  Skipping doses also puts you at risk for problems. Contact a health care provider if:  You think you are having a reaction to medicines you have taken.  You have repeated (recurrent) headaches.  You feel dizzy.  You have swelling in your ankles.  You have trouble with your vision. Get help right away if:  You develop a severe headache or confusion.  You have unusual weakness or numbness, or you feel faint.  You have severe pain in your chest or abdomen.  You vomit repeatedly.  You have trouble breathing. Summary  Hypertension is when the force of blood pumping through your arteries is too strong. If this condition is not controlled, it may put you at risk for serious complications.  Your personal target blood pressure may vary depending on your medical conditions, your age, and other factors. For most people, a normal blood pressure is less than 120/80.  Hypertension is managed by lifestyle changes, medicines, or both. Lifestyle changes include weight loss, eating a healthy, low-sodium diet, exercising more, and limiting alcohol. This information is not intended to replace advice given to you by your health care provider. Make sure you discuss any questions you have with your health care provider. Document Released: 03/22/2012 Document Revised: 05/26/2016 Document Reviewed: 05/26/2016 Elsevier Interactive Patient Education  2017 [employer].

## 2016-11-21 ENCOUNTER — Encounter: Payer: Self-pay | Admitting: Family Medicine

## 2016-11-21 LAB — MICROALBUMIN / CREATININE URINE RATIO
Creatinine, Urine: 106 mg/dL (ref 20–370)
MICROALB UR: 0.6 mg/dL
Microalb Creat Ratio: 6 mcg/mg creat (ref <30)

## 2016-11-21 LAB — PSA: PSA: 1.6 ng/mL (ref <=4.0)

## 2016-11-22 ENCOUNTER — Telehealth: Payer: Self-pay | Admitting: *Deleted

## 2016-11-22 NOTE — Telephone Encounter (Signed)
Lab letter sent 

## 2016-12-26 ENCOUNTER — Other Ambulatory Visit: Payer: Self-pay | Admitting: Family Medicine

## 2017-03-21 ENCOUNTER — Other Ambulatory Visit: Payer: Self-pay | Admitting: Family Medicine

## 2017-03-22 NOTE — Telephone Encounter (Signed)
CALLED PATIENT HIS WIFE STATES THAT PT DOESN'T NEED ANY REFILLS AT THE TIME SHE HAS ABOUT 3 MONTH SUPPLY NOW AND WILL CALL AT THE END OF NOV TO SET UP AN PHYSICAL WITH MED REFILLS FOR DECEMBER

## 2017-05-15 ENCOUNTER — Other Ambulatory Visit: Payer: Self-pay | Admitting: Family Medicine

## 2017-05-18 NOTE — Telephone Encounter (Signed)
Copied from [place] [id number]. Topic: Quick Communication - See Telephone Encounter >> May 18, 2017  9:51 AM Burnis Medin, NT wrote: CRM for notification. See Telephone encounter for: [facility] is calling about prescription refills. Prescriptions are glipiZIDE (GLUCOTROL XL) 10 MG 24 hr tablet and atenolol-chlorthalidone (TENORETIC) 100-25 MG tablet 90 day supply.  05/18/17.

## 2017-06-22 ENCOUNTER — Other Ambulatory Visit: Payer: Self-pay

## 2017-06-22 ENCOUNTER — Ambulatory Visit: Payer: BLUE CROSS/BLUE SHIELD | Admitting: Family Medicine

## 2017-06-22 ENCOUNTER — Encounter: Payer: Self-pay | Admitting: Family Medicine

## 2017-06-22 VITALS — BP 170/68 | HR 64 | Temp 98.8°F | Resp 16 | Ht 70.0 in | Wt 221.6 lb

## 2017-06-22 DIAGNOSIS — E079 Disorder of thyroid, unspecified: Secondary | ICD-10-CM | POA: Diagnosis not present

## 2017-06-22 DIAGNOSIS — Z119 Encounter for screening for infectious and parasitic diseases, unspecified: Secondary | ICD-10-CM | POA: Diagnosis not present

## 2017-06-22 DIAGNOSIS — Z5181 Encounter for therapeutic drug level monitoring: Secondary | ICD-10-CM

## 2017-06-22 DIAGNOSIS — I10 Essential (primary) hypertension: Secondary | ICD-10-CM

## 2017-06-22 DIAGNOSIS — R221 Localized swelling, mass and lump, neck: Secondary | ICD-10-CM | POA: Diagnosis not present

## 2017-06-22 DIAGNOSIS — E1165 Type 2 diabetes mellitus with hyperglycemia: Secondary | ICD-10-CM | POA: Diagnosis not present

## 2017-06-22 LAB — POCT URINALYSIS DIP (MANUAL ENTRY)
BILIRUBIN UA: NEGATIVE mg/dL
Bilirubin, UA: NEGATIVE
Glucose, UA: NEGATIVE mg/dL
Leukocytes, UA: NEGATIVE
Nitrite, UA: NEGATIVE
PROTEIN UA: NEGATIVE mg/dL
RBC UA: NEGATIVE
UROBILINOGEN UA: 0.2 U/dL
pH, UA: 5 (ref 5.0–8.0)

## 2017-06-22 LAB — POCT GLYCOSYLATED HEMOGLOBIN (HGB A1C): Hemoglobin A1C: 13

## 2017-06-22 MED ORDER — DAPAGLIFLOZIN PRO-METFORMIN ER 2.5-1000 MG PO TB24: 2.0000 | ORAL_TABLET | ORAL | 1 refills | Status: DC

## 2017-06-22 MED ORDER — FREESTYLE LIBRE SENSOR SYSTEM MISC: 1.0000 [IU] | 3 refills | Status: DC | PRN

## 2017-06-22 MED ORDER — FREESTYLE LIBRE READER DEVI: 1.0000 [IU] | Freq: Once | 0 refills | Status: AC

## 2017-06-22 MED ORDER — AMLODIPINE-OLMESARTAN 5-40 MG PO TABS: 1.0000 | ORAL_TABLET | Freq: Every day | ORAL | 1 refills | Status: DC

## 2017-06-22 NOTE — Progress Notes (Addendum)
Subjective:  By signing my name below, I, Barry Gardner, attest that this documentation has been prepared under the direction and in the presence of [name], MD Electronically Signed: [name], ED Scribe 06/22/2017 at 6:15 PM.   Patient ID: Barry Gardner, male    DOB: [birth year], 66 y.o.   MRN: [id number]  Chief Complaint  Patient presents with  . Follow-up    blood pressure medication    HPI Barry Gardner is a 66 y.o. male who presents to Primary Care at [facility] for follow-up on BP meds. Pt states that he has been compliant with BP medications. He does not check his BP outside of the office but he suspects that his BP is elevated due to working 80 hours/week.  DM Pt states he has not noticed a change in his diet that would cause his A1C to increase.Denies any symptoms. Pt reports urinating 1-2 times/night. States he drinks about 2 16 oz bottles of water/day. His daughter also wants pt to switch to the 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 No h/o pancreatitis.  Neck Pt's daughter has noticed that the mass to his R lower neck has gradually enlarged.  Past Medical History:  Diagnosis Date  . Diabetes mellitus without complication [facility])    Current Outpatient Medications on File Prior to Visit  Medication Sig Dispense Refill  . Ascorbic Acid (VITAMIN C) 1000 MG tablet Take 1,000 mg by mouth daily.    Marland Kitchen aspirin 81 MG tablet Take 81 mg by mouth daily.    Marland Kitchen atenolol-chlorthalidone (TENORETIC) 100-25 MG tablet Take 1 tablet by mouth daily. 90 tablet 1  . b complex vitamins capsule Take 1 capsule by mouth daily.    . Cholecalciferol (VITAMIN D-3) 5000 UNITS TABS Take by mouth.    Marland Kitchen glipiZIDE (GLUCOTROL XL) 10 MG 24 hr tablet Take 1 tablet (10 mg total) by mouth daily with breakfast. 90 tablet 1  . LYCOPENE PO Take by mouth daily.    . metFORMIN (GLUCOPHAGE-XR) 500 MG 24 hr tablet TAKE 4 TABLETS (2,000 MG TOTAL) BY MOUTH DAILY WITH  BREAKFAST. 360 tablet 1  . multivitamin-iron-minerals-folic acid (CENTRUM) chewable tablet Chew 1 tablet by mouth daily.    . quinapril (ACCUPRIL) 40 MG tablet Take 1 tablet (40 mg total) by mouth daily. 90 tablet 1  . Saw Palmetto, Serenoa repens, (SAW PALMETTO PO) Take by mouth 2 (two) times daily.    . tamsulosin (FLOMAX) 0.4 MG CAPS capsule TAKE ONE CAPSULE BY MOUTH EVERY DAY 30 capsule 0  . BAYER MICROLET LANCETS lancets Use to check blood sugar daily.  Dx: E11.65 (Patient not taking: Reported on 06/22/2017) 100 each 3  . glucose blood (CONTOUR NEXT TEST) test strip Use to check blood sugar daily. Dx: E11.65 (Patient not taking: Reported on 06/22/2017) 100 each 3   No current facility-administered medications on file prior to visit.    No Known Allergies   Past Surgical History:  Procedure Laterality Date  . CHOLECYSTECTOMY    . GALLBLADDER SURGERY     Family History  Problem Relation Age of Onset  . Diabetes Mother   . Dementia Mother   . Asthma Mother   . Stroke Father   . Aneurysm Father   . Diabetes Brother    Social History   Socioeconomic History  . Marital status: Single    Spouse name: None  . Number of children: None  . Years  of education: None  . Highest education level: None  Social Needs  . Financial resource strain: None  . Food insecurity - worry: None  . Food insecurity - inability: None  . Transportation needs - medical: None  . Transportation needs - non-medical: None  Occupational History  . None  Tobacco Use  . Smoking status: Never Smoker  . Smokeless tobacco: Never Used  Substance and Sexual Activity  . Alcohol use: No    Alcohol/week: 0.0 oz  . Drug use: No  . Sexual activity: None  Other Topics Concern  . None  Social History Narrative  . None   Depression screen [facility] 2/9 06/22/2017 11/20/2016 05/20/2016 05/20/2016 05/21/2015  Decreased Interest 0 0 0 0 0  Down, Depressed, Hopeless 0 0 0 0 0  PHQ - 2 Score 0 0 0 0 0    Review of  Systems  HENT:       +R neck mass      Objective:   Physical Exam  Constitutional: He is oriented to person, place, and time. He appears well-developed and well-nourished. No distress.  HENT:  Head: Normocephalic and atraumatic.  Eyes: Conjunctivae and EOM are normal.  Neck: Neck supple. No tracheal deviation present.  3cm diameter R lower neck  Cardiovascular: Normal rate and regular rhythm.  Pulmonary/Chest: Effort normal and breath sounds normal. No respiratory distress.  Musculoskeletal: Normal range of motion. He exhibits no edema.  Neurological: He is alert and oriented to person, place, and time.  Skin: Skin is warm and dry.  Psychiatric: He has a normal mood and affect. His behavior is normal.  Nursing note and vitals reviewed.  BP (!) 164/78   Pulse 64   Temp 98.8 F (37.1 C)   Resp 16   Ht 5\' 10"  (1.778 m)   Wt 221 lb 9.6 oz (100.5 kg)   SpO2 97%   BMI 31.80 kg/m    Diabetic Foot Exam - Simple   Simple Foot Form Diabetic Foot exam was performed with the following findings:  Yes 06/22/2017  5:51 PM  Visual Inspection No deformities, no ulcerations, no other skin breakdown bilaterally:  Yes Sensation Testing Intact to touch and monofilament testing bilaterally:  Yes Pulse Check Posterior Tibialis and Dorsalis pulse intact bilaterally:  Yes Comments    Results for orders placed or performed in visit on 06/22/17  POCT glycosylated hemoglobin (Hb A1C)  Result Value Ref Range   Hemoglobin A1C 13.0    Assessment & Plan:   1. Uncontrolled type 2 diabetes mellitus with hyperglycemia ([place]) - add Faxiga into XR metformin 1g/d - combo pill, cont glucotrol.  2. Essential hypertension, benign - uncontrolled. change quinapril to olmesartan and start amlodipine 5.  D/C the hctz 25mg  - was not supposed to be on as changed from this to chlorthalidone (in the combo pill w/ atenolol) at last visit.  3. Screening examination for infectious disease   4. Medication monitoring  encounter   5. Mass in neck - suspect thyroid - this is very hard, large, well-defined, non-mobile - it is highly concerning for poss malignancy. Needs imaging asap  6. Thyroid mass     Orders Placed This Encounter  Procedures  . US THYROID    Standing Status:   Future    Standing Expiration Date:   08/23/2018    Scheduling Instructions:     Late evening - after 5:30 is highly preferable or anytime between 1/1-1/04/2018. Thanks.    Order Specific Question:  Reason for Exam (SYMPTOM  OR DIAGNOSIS REQUIRED)    Answer:   right thyroid/neck mass    Order Specific Question:   Preferred imaging location?    Answer:   [id number]  . Comprehensive metabolic panel  . LDL Cholesterol, Direct  . TSH  . TSH  . Hepatitis C antibody  . Ambulatory referral to Ophthalmology    Referral Priority:   Routine    Referral Type:   Consultation    Referral Reason:   Specialty Services Required    Requested Specialty:   Ophthalmology    Number of Visits Requested:   1  . POCT glycosylated hemoglobin (Hb A1C)  . POCT urinalysis dipstick  . HM DIABETES FOOT EXAM    Meds ordered this encounter  Medications  . amLODipine-olmesartan (AZOR) 5-40 MG tablet    Sig: Take 1 tablet by mouth daily.    Dispense:  90 tablet    Refill:  1  . Dapagliflozin-Metformin HCl ER (XIGDUO XR) 2.11-998 MG TB24    Sig: Take 2 tablets by mouth every morning.    Dispense:  90 tablet    Refill:  1  . Continuous Blood Gluc Receiver (FREESTYLE LIBRE READER) DEVI    Sig: 1 Units by Does not apply route once for 1 dose. check cbgs qid. Dx E11.65, Reason for testing freq: hyperglycemia, medication changes    Dispense:  1 Device    Refill:  0  . Continuous Blood Gluc Sensor (FREESTYLE LIBRE SENSOR SYSTEM) MISC    Sig: 1 Units by Does not apply route as needed. To check cbgs at least qid. Dx E11.65, Reason for testing frequency: hyperglycemia, med changes    Dispense:  9 each    Refill:  3    Please give a 90 day supply  with 3 refills    I personally performed the services described in this documentation, which was scribed in my presence. The recorded information has been reviewed and considered, and addended by me as needed.   [name], M.D.  Primary Care at [facility] [address] [place], [place] [address] [phone] phone [phone] fax  06/24/17 6:42 PM

## 2017-06-22 NOTE — Patient Instructions (Addendum)
D/C HCTZ, ACCUPRIL (QUINAPRIL), AND METFORMIN XR. START XIGDUO XR (FARXIGA-METFORMIN) 2 TABS IN A.M. AND AZOR (AMLODIPINE-OLMESARTAN) 1 TAB IN A.M. CONTINUE ATENOLOL-CHLORTHALIDONE AND GLUCOTROL XL. EVERYTHING CAN BE TAKEN ALL TOGETHER IN THE MORNING.  IF you received an x-ray today, you will receive an invoice from [facility]. Please contact [facility] at [phone] with questions or concerns regarding your invoice.   IF you received labwork today, you will receive an invoice from [facility]. Please contact [facility] at [phone] with questions or concerns regarding your invoice.   Our billing staff will not be able to assist you with questions regarding bills from these companies.  You will be contacted with the lab results as soon as they are available. The fastest way to get your results is to activate your My Chart account. Instructions are located on the last page of this paperwork. If you have not heard from [place] regarding the results in 2 weeks, please contact this office.      Diabetes Mellitus and Standards of Medical Care Managing diabetes (diabetes mellitus) can be complicated. Your diabetes treatment may be managed by a team of health care providers, including:  A diet and nutrition specialist (registered dietitian).  A nurse.  A certified diabetes educator (CDE).  A diabetes specialist (endocrinologist).  An eye doctor.  A primary care provider.  A dentist.  Your health care providers follow a schedule in order to help you get the best quality of care. The following schedule is a general guideline for your diabetes management plan. Your health care providers may also give you more specific instructions. HbA1c ( hemoglobin A1c) test This test provides information about blood sugar (glucose) control over the previous 2-3 months. It is used to check whether your diabetes management plan needs to be adjusted.  If you are meeting your treatment  goals, this test is done at least 2 times a year.  If you are not meeting treatment goals or if your treatment goals have changed, this test is done 4 times a year.  Blood pressure test  This test is done at every routine medical visit. For most people, the goal is less than 130/80. Ask your health care provider what your goal blood pressure should be. Dental and eye exams  Visit your dentist two times a year.  If you have type 1 diabetes, get an eye exam 3-5 years after you are diagnosed, and then once a year after your first exam. ? If you were diagnosed with type 1 diabetes as a child, get an eye exam when you are age 39 or older and have had diabetes for 3-5 years. After the first exam, you should get an eye exam once a year.  If you have type 2 diabetes, have an eye exam as soon as you are diagnosed, and then once a year after your first exam. Foot care exam  Visual foot exams are done at every routine medical visit. The exams check for cuts, bruises, redness, blisters, sores, or other problems with the feet.  A complete foot exam is done by your health care provider once a year. This exam includes an inspection of the structure and skin of your feet, and a check of the pulses and sensation in your feet. ? Type 1 diabetes: Get your first exam 3-5 years after diagnosis. ? Type 2 diabetes: Get your first exam as soon as you are diagnosed.  Check your feet every day for cuts, bruises, redness, blisters, or sores. If you  have any of these or other problems that are not healing, contact your health care provider. Kidney function test ( urine microalbumin)  This test is done once a year. ? Type 1 diabetes: Get your first test 5 years after diagnosis. ? Type 2 diabetes: Get your first test as soon as you are diagnosed.  If you have chronic kidney disease (CKD), get a serum creatinine and estimated glomerular filtration rate (eGFR) test once a year. Lipid profile (cholesterol, HDL, LDL,  triglycerides)  This test should be done when you are diagnosed with diabetes, and every 5 years after the first test. If you are on medicines to lower your cholesterol, you may need to get this test done every year. ? The goal for LDL is less than 100 mg/dL (5.5 mmol/L). If you are at high risk, the goal is less than 70 mg/dL (3.9 mmol/L). ? The goal for HDL is 40 mg/dL (2.2 mmol/L) for men and 50 mg/dL(2.8 mmol/L) for women. An HDL cholesterol of 60 mg/dL (3.3 mmol/L) or higher gives some protection against heart disease. ? The goal for triglycerides is less than 150 mg/dL (8.3 mmol/L). Immunizations  The yearly flu (influenza) vaccine is recommended for everyone 6 months or older who has diabetes.  The pneumonia (pneumococcal) vaccine is recommended for everyone 2 years or older who has diabetes. If you are 34 or older, you may get the pneumonia vaccine as a series of two separate shots.  The hepatitis B vaccine is recommended for adults shortly after they have been diagnosed with diabetes.  The Tdap (tetanus, diphtheria, and pertussis) vaccine should be given: ? According to normal childhood vaccination schedules, for children. ? Every 10 years, for adults who have diabetes.  The shingles vaccine is recommended for people who have had chicken pox and are 50 years or older. Mental and emotional health  Screening for symptoms of eating disorders, anxiety, and depression is recommended at the time of diagnosis and afterward as needed. If your screening shows that you have symptoms (you have a positive screening result), you may need further evaluation and be referred to a mental health care provider. Diabetes self-management education  Education about how to manage your diabetes is recommended at diagnosis and ongoing as needed. Treatment plan  Your treatment plan will be reviewed at every medical visit. Summary  Managing diabetes (diabetes mellitus) can be complicated. Your diabetes  treatment may be managed by a team of health care providers.  Your health care providers follow a schedule in order to help you get the best quality of care.  Standards of care including having regular physical exams, blood tests, blood pressure monitoring, immunizations, screening tests, and education about how to manage your diabetes.  Your health care providers may also give you more specific instructions based on your individual health. This information is not intended to replace advice given to you by your health care provider. Make sure you discuss any questions you have with your health care provider. Document Released: 04/25/2009 Document Revised: 03/26/2016 Document Reviewed: 03/26/2016 Elsevier Interactive Patient Education  [employer].

## 2017-06-23 ENCOUNTER — Telehealth: Payer: Self-pay | Admitting: Family Medicine

## 2017-06-23 ENCOUNTER — Other Ambulatory Visit: Payer: Self-pay | Admitting: Family Medicine

## 2017-06-23 LAB — COMPREHENSIVE METABOLIC PANEL
AG Ratio: 1.8 (calc) (ref 1.0–2.5)
ALT: 15 U/L (ref 9–46)
AST: 16 U/L (ref 10–35)
Albumin: 4.2 g/dL (ref 3.6–5.1)
Alkaline phosphatase (APISO): 52 U/L (ref 40–115)
BUN: 25 mg/dL (ref 7–25)
CO2: 27 mmol/L (ref 20–32)
CREATININE: 1.07 mg/dL (ref 0.70–1.25)
Calcium: 9.6 mg/dL (ref 8.6–10.3)
Chloride: 99 mmol/L (ref 98–110)
GLUCOSE: 192 mg/dL — AB (ref 65–99)
Globulin: 2.3 g/dL (calc) (ref 1.9–3.7)
Potassium: 3.6 mmol/L (ref 3.5–5.3)
Sodium: 138 mmol/L (ref 135–146)
Total Bilirubin: 0.3 mg/dL (ref 0.2–1.2)
Total Protein: 6.5 g/dL (ref 6.1–8.1)

## 2017-06-23 LAB — HEPATITIS C ANTIBODY
HEP C AB: NONREACTIVE
SIGNAL TO CUT-OFF: 0.01 (ref <1.00)

## 2017-06-23 LAB — TSH: TSH: 1.36 m[IU]/L (ref 0.40–4.50)

## 2017-06-23 LAB — LDL CHOLESTEROL, DIRECT: Direct LDL: 119 mg/dL — ABNORMAL HIGH (ref <100)

## 2017-06-23 NOTE — Telephone Encounter (Signed)
Copied from [place] [id number]. Topic: Quick Communication - See Telephone Encounter >> Jun 23, 2017  3:15 PM Boyd Kerbs wrote: CRM for notification. See Telephone encounter for:  10 day supply for 90 days is needed -  This was suppose to be done yesterday. Pharmacy said this was sent in wrong.   Quinapril HCl 40 MG TAKE 1 TABLET BY MOUTH EVERY [id number] [address], [place] [address] Phone: [phone] Fax: [phone] Not a 24 hour pharmacy; exact hours not known  Daughter is at pharmacy waiting on this  06/23/17.

## 2017-06-24 NOTE — Telephone Encounter (Signed)
This medication was changed at appointment- discontinued.

## 2017-06-25 ENCOUNTER — Other Ambulatory Visit: Payer: Self-pay | Admitting: Family Medicine

## 2017-06-30 ENCOUNTER — Telehealth: Payer: Self-pay | Admitting: Family Medicine

## 2017-06-30 NOTE — Telephone Encounter (Signed)
Copied from [place]. Topic: Referral - Question >> Jun 30, 2017  1:12 PM Corie Chiquito, [place] wrote: Reason for CRM: Levada Dy from [facility] called because the patient was referred to there office but when she reached out to him she was told by the daughter that he has an eye doctor and doesn't understand why he was referred to them.If someone could give her a call back at [phone]

## 2017-07-01 NOTE — Telephone Encounter (Signed)
Sent to referrals

## 2017-07-01 NOTE — Telephone Encounter (Signed)
Scheduling pool does not schedule for referrals

## 2017-07-04 ENCOUNTER — Telehealth: Payer: Self-pay | Admitting: Family Medicine

## 2017-07-04 NOTE — Telephone Encounter (Signed)
Pt daughter stated that she is still waiting on his labs pt daughter is upset to the fact that she has been waiting since 12/12 for his lab work.. Also pt stated that she needs his flomax refilled and she has been waiting a while for that as well..  Please advise

## 2017-07-05 MED ORDER — PRAVASTATIN SODIUM 40 MG PO TABS: 40.0000 mg | ORAL_TABLET | Freq: Every day | ORAL | 1 refills | Status: DC

## 2017-07-05 MED ORDER — TAMSULOSIN HCL 0.4 MG PO CAPS: 0.4000 mg | ORAL_CAPSULE | Freq: Every day | ORAL | 3 refills | Status: DC

## 2017-07-05 NOTE — Telephone Encounter (Signed)
No need for him to schedule referral if his annual diabetic ophtho exam is UTD.  Please contact pt's daughter for info -we need to know who is eye doctor is - (poss Dr. Gevena Cotton at [facility] as was referred there in 2015 but never got confirmation that he sched an appt w/ the) and get a copy of his last annual exam for his diabetic continuity of care. We do not have any prior rec from any eye dr.

## 2017-07-05 NOTE — Telephone Encounter (Signed)
Labs sent to lab pool Pt needs to start on a cholesterol medication. Thyroid fine. flomax was sent in to pharm last wk - 12/18.  Sent in another refill in case it didn't go through for some reason.

## 2017-07-06 NOTE — Telephone Encounter (Signed)
Called pt and spoke with his daughter.  She states that he has been seeing Dr. Frederico Hamman at [facility].  She also states that we should already have this information, and he is not currently scheduled and won't be until next year.  She did not want to talk to me at all and this was all the information I could get from her.

## 2017-07-07 NOTE — Telephone Encounter (Signed)
Informed pt daughter of labs and refill ready to pick up at pharmacy, she verbalized understand.

## 2017-07-09 NOTE — Telephone Encounter (Signed)
Please call [facility] and ask for copy of last note to be faxed to [place] as well as request that Dr. Frederico Hamman cc [place] on all future visits - shouldn't need to sign release since we put in initial referral in 2015 and we are PCP. Thanks.

## 2017-07-09 NOTE — Telephone Encounter (Signed)
Thanks for your efforts.

## 2017-07-10 ENCOUNTER — Encounter: Payer: Self-pay | Admitting: Family Medicine

## 2017-07-12 DIAGNOSIS — C73 Malignant neoplasm of thyroid gland: Secondary | ICD-10-CM

## 2017-07-12 HISTORY — DX: Malignant neoplasm of thyroid gland: C73

## 2017-07-21 ENCOUNTER — Telehealth: Payer: Self-pay | Admitting: Family Medicine

## 2017-07-21 NOTE — Telephone Encounter (Signed)
Pt daughter [name] called and wanted to verify what medication pt should be taking. She stated Dr. Brigitte Pulse updated his medications on his last OV and she was taking his meds to him tonight.  I adv her I could send Dr. Brigitte Pulse a message b/c she was seeing pt's and have someone call her back. She stated she needed to know now b/c she was taking his meds to him tonight and a message would take 3 days. I again adv her I could put the message in and Dr. Brigitte Pulse would return the call when she is available. [name] stated never mind that is fine, it will take to long for someone to return her phone call.

## 2017-08-23 ENCOUNTER — Other Ambulatory Visit: Payer: Self-pay

## 2017-08-23 NOTE — Telephone Encounter (Signed)
Last seen 06/22/17 No follow up schedule, per note needs to come back around 10/21/17. Requesting 90 day supply

## 2017-08-24 MED ORDER — ATENOLOL-CHLORTHALIDONE 100-25 MG PO TABS: 1.0000 | ORAL_TABLET | Freq: Every day | ORAL | 1 refills | Status: DC

## 2017-08-24 NOTE — Telephone Encounter (Signed)
What med is this referring to?  There is no med attached or noted anywhere.  At his last visit in December, I gave him 6 mos on all his meds so he should be good until his f/u. Except for the atenolol-chlorthalidone - in my last OV note, assessment, I state that he should continue atenolol-chlorthalidone though he should have ran out 3 mos ago if he was taking regularly. Will send in refill of the atenolol-chlorthalidone (which would have been fine for CMA to go ahead and do per instructions in last OV.) If med refill was for other med, please see why 3 mo early refill is needed.

## 2017-08-24 NOTE — Addendum Note (Signed)
Addended by: Shawnee Knapp on: 08/24/2017 04:34 AM   Modules accepted: Orders

## 2017-08-26 NOTE — Telephone Encounter (Signed)
LMOVM for pt to call office and make appt 10/10/2017 with Dr. Brigitte Pulse.

## 2017-09-10 ENCOUNTER — Other Ambulatory Visit: Payer: Self-pay | Admitting: Family Medicine

## 2017-10-17 ENCOUNTER — Ambulatory Visit
Admission: RE | Admit: 2017-10-17 | Discharge: 2017-10-17 | Disposition: A | Discharge status: Home / Self Care | Payer: BLUE CROSS/BLUE SHIELD | Source: Ambulatory Visit | Attending: Family Medicine | Admitting: Family Medicine

## 2017-10-17 ENCOUNTER — Other Ambulatory Visit: Payer: Self-pay

## 2017-10-17 DIAGNOSIS — R221 Localized swelling, mass and lump, neck: Secondary | ICD-10-CM

## 2017-10-17 DIAGNOSIS — E079 Disorder of thyroid, unspecified: Secondary | ICD-10-CM

## 2017-10-18 ENCOUNTER — Telehealth: Payer: Self-pay | Admitting: Family Medicine

## 2017-10-18 DIAGNOSIS — E1165 Type 2 diabetes mellitus with hyperglycemia: Secondary | ICD-10-CM

## 2017-10-18 DIAGNOSIS — Z5181 Encounter for therapeutic drug level monitoring: Secondary | ICD-10-CM

## 2017-10-18 DIAGNOSIS — R221 Localized swelling, mass and lump, neck: Secondary | ICD-10-CM

## 2017-10-18 DIAGNOSIS — R590 Localized enlarged lymph nodes: Secondary | ICD-10-CM

## 2017-10-18 DIAGNOSIS — E079 Disorder of thyroid, unspecified: Secondary | ICD-10-CM

## 2017-10-18 NOTE — Telephone Encounter (Signed)
Trying to get neck CT w/ contrast for this Fri afternoon  4/12 - then appt for US-guided FNA bx the following Fri. Appt w/ Dr. Constance Holster first avail after neck CT - prefers Fri if any avail in timely manner. If neck CT is able to be sched for 4/12, may need to resched appt here and make sure pt comes in prior for labs as they will need renal fxn - will order stat in case can only come in Thus afternoon or Fri a.m.

## 2017-10-19 ENCOUNTER — Telehealth: Payer: Self-pay | Admitting: Family Medicine

## 2017-10-19 ENCOUNTER — Ambulatory Visit (INDEPENDENT_AMBULATORY_CARE_PROVIDER_SITE_OTHER): Payer: BLUE CROSS/BLUE SHIELD | Admitting: Physician Assistant

## 2017-10-19 ENCOUNTER — Other Ambulatory Visit: Payer: Self-pay | Admitting: Family Medicine

## 2017-10-19 DIAGNOSIS — R221 Localized swelling, mass and lump, neck: Secondary | ICD-10-CM

## 2017-10-19 DIAGNOSIS — E079 Disorder of thyroid, unspecified: Secondary | ICD-10-CM

## 2017-10-19 DIAGNOSIS — R590 Localized enlarged lymph nodes: Secondary | ICD-10-CM

## 2017-10-19 DIAGNOSIS — E1165 Type 2 diabetes mellitus with hyperglycemia: Secondary | ICD-10-CM

## 2017-10-19 DIAGNOSIS — Z5181 Encounter for therapeutic drug level monitoring: Secondary | ICD-10-CM

## 2017-10-19 LAB — COMPREHENSIVE METABOLIC PANEL
A/G RATIO: 1.9 (ref 1.2–2.2)
ALK PHOS: 50 IU/L (ref 39–117)
ALT: 16 IU/L (ref 0–44)
AST: 17 IU/L (ref 0–40)
Albumin: 4.7 g/dL (ref 3.6–4.8)
BUN/Creatinine Ratio: 20 (ref 10–24)
BUN: 23 mg/dL (ref 8–27)
Bilirubin Total: 0.2 mg/dL (ref 0.0–1.2)
CALCIUM: 9.6 mg/dL (ref 8.6–10.2)
CO2: 27 mmol/L (ref 20–29)
Chloride: 98 mmol/L (ref 96–106)
Creatinine, Ser: 1.17 mg/dL (ref 0.76–1.27)
GFR calc Af Amer: 74 mL/min/{1.73_m2} (ref >59)
GFR calc non Af Amer: 64 mL/min/{1.73_m2} (ref >59)
Globulin, Total: 2.5 g/dL (ref 1.5–4.5)
Glucose: 152 mg/dL — ABNORMAL HIGH (ref 65–99)
POTASSIUM: 4.3 mmol/L (ref 3.5–5.2)
Sodium: 143 mmol/L (ref 134–144)
Total Protein: 7.2 g/dL (ref 6.0–8.5)

## 2017-10-19 NOTE — Telephone Encounter (Signed)
Tried to get pt scheduled at [facility], [facility], and [facility] but none of these facilities had appts for 10/21/17. I asked pt's daughter if they were okay with going to [facility] and she said that was fine. They are scheduled for 5pm on 10/21/17 at [place]. Pt does have appt with Dr. Brigitte Pulse at 3:40pm but [facility] said if the pt does not make it right at 5pm this is okay, they can push the appt later and pt can just call to let them know they will be later. I gave pt's daughter address and phone number for both [facility] and their imaging department. If they have to come later than the 5pm appt, they will need to check in at ED but if they can get there for 5pm appt, they can check in at outpatient. They need to tell check-in an appt is scheduled. I also tried to get FNA U/S scheduled at [facility] but they are unable to do this type of U/S, and said the hospital would have to do this. However, the order was canceled during trying to schedule with [place], so we will need a new order placed to get this scheduled. I let [facility] radiology know I would call back to schedule this. Thanks!!

## 2017-10-19 NOTE — Addendum Note (Signed)
Addended by: Shawnee Knapp on: 10/19/2017 04:00 PM   Modules accepted: Orders

## 2017-10-19 NOTE — Telephone Encounter (Signed)
AMAZING! Thank you Deneise Lever. Not to confuse things further - but would Javonn and his daughter [name] (the main contact, decision maker, care giver for everything - sched appts through her and she will make sure [facility] gets there ) be willing to reschedule their Fri appt w/ me for a Saturday visit instead? - ok to put in one of the "same-day" slots.  I don't want to stress them (and myself) out about trying to make sure they get to [facility] at a reasonable time (and we all know it will be a bloody miracle if I'm not running an hour behind on a Fri mid afternoon). And if they could come in on Sat instead, I would have the results of the CT scan back to review with them in detail.. . .  Will place new FNA order.

## 2017-10-19 NOTE — Telephone Encounter (Signed)
Pt's daughter also aware that pt needs labs drawn here no later than 10/20/17

## 2017-10-19 NOTE — Telephone Encounter (Signed)
US-guided FNA biopsy re-ordered for [facility] location.

## 2017-10-19 NOTE — Progress Notes (Signed)
Lab only visit, patient was not seen by provider.

## 2017-10-20 LAB — CBC WITH DIFFERENTIAL/PLATELET
BASOS ABS: 0 10*3/uL (ref 0.0–0.2)
Basos: 0 %
EOS (ABSOLUTE): 0 10*3/uL (ref 0.0–0.4)
Eos: 0 %
HEMOGLOBIN: 14.4 g/dL (ref 13.0–17.7)
Hematocrit: 41.8 % (ref 37.5–51.0)
Immature Grans (Abs): 0 10*3/uL (ref 0.0–0.1)
Immature Granulocytes: 0 %
LYMPHS ABS: 2.4 10*3/uL (ref 0.7–3.1)
Lymphs: 34 %
MCH: 31.4 pg (ref 26.6–33.0)
MCHC: 34.4 g/dL (ref 31.5–35.7)
MCV: 91 fL (ref 79–97)
MONOCYTES: 7 %
MONOS ABS: 0.5 10*3/uL (ref 0.1–0.9)
NEUTROS ABS: 4.1 10*3/uL (ref 1.4–7.0)
Neutrophils: 59 %
PLATELETS: 179 10*3/uL (ref 150–379)
RBC: 4.58 x10E6/uL (ref 4.14–5.80)
RDW: 13.4 % (ref 12.3–15.4)
WBC: 7 10*3/uL (ref 3.4–10.8)

## 2017-10-20 LAB — TSH+T4F+T3FREE
Free T4: 1.17 ng/dL (ref 0.82–1.77)
T3 FREE: 3.2 pg/mL (ref 2.0–4.4)
TSH: 1.84 u[IU]/mL (ref 0.450–4.500)

## 2017-10-20 LAB — HEMOGLOBIN A1C
ESTIMATED AVERAGE GLUCOSE: 143 mg/dL
HEMOGLOBIN A1C: 6.6 % — AB (ref 4.8–5.6)

## 2017-10-20 LAB — MICROALBUMIN / CREATININE URINE RATIO
CREATININE, UR: 207.8 mg/dL
Microalb/Creat Ratio: 6.9 mg/g creat (ref 0.0–30.0)
Microalbumin, Urine: 14.4 ug/mL

## 2017-10-21 ENCOUNTER — Encounter: Payer: Self-pay | Admitting: Family Medicine

## 2017-10-21 ENCOUNTER — Ambulatory Visit: Payer: BLUE CROSS/BLUE SHIELD | Admitting: Family Medicine

## 2017-10-21 ENCOUNTER — Ambulatory Visit (HOSPITAL_BASED_OUTPATIENT_CLINIC_OR_DEPARTMENT_OTHER)
Admission: RE | Admit: 2017-10-21 | Discharge: 2017-10-21 | Disposition: A | Discharge status: Home / Self Care | Payer: BLUE CROSS/BLUE SHIELD | Source: Ambulatory Visit | Attending: Family Medicine | Admitting: Family Medicine

## 2017-10-21 ENCOUNTER — Encounter (HOSPITAL_BASED_OUTPATIENT_CLINIC_OR_DEPARTMENT_OTHER): Payer: Self-pay

## 2017-10-21 VITALS — BP 138/60 | HR 63 | Temp 98.2°F | Resp 18 | Ht 70.0 in | Wt 215.4 lb

## 2017-10-21 DIAGNOSIS — R590 Localized enlarged lymph nodes: Secondary | ICD-10-CM

## 2017-10-21 DIAGNOSIS — E042 Nontoxic multinodular goiter: Secondary | ICD-10-CM | POA: Diagnosis not present

## 2017-10-21 DIAGNOSIS — R221 Localized swelling, mass and lump, neck: Secondary | ICD-10-CM | POA: Diagnosis not present

## 2017-10-21 DIAGNOSIS — R6889 Other general symptoms and signs: Secondary | ICD-10-CM | POA: Diagnosis not present

## 2017-10-21 DIAGNOSIS — E079 Disorder of thyroid, unspecified: Secondary | ICD-10-CM

## 2017-10-21 DIAGNOSIS — E1165 Type 2 diabetes mellitus with hyperglycemia: Secondary | ICD-10-CM

## 2017-10-21 DIAGNOSIS — I10 Essential (primary) hypertension: Secondary | ICD-10-CM | POA: Diagnosis not present

## 2017-10-21 DIAGNOSIS — E785 Hyperlipidemia, unspecified: Secondary | ICD-10-CM

## 2017-10-21 HISTORY — DX: Essential (primary) hypertension: I10

## 2017-10-21 MED ORDER — IOPAMIDOL (ISOVUE-300) INJECTION 61%
100.0000 mL | Freq: Once | INTRAVENOUS | Status: AC | PRN
  Administered 2017-10-21: 75 mL via INTRAVENOUS

## 2017-10-21 NOTE — Progress Notes (Signed)
Subjective:  By signing my name below, I, Barry Gardner, attest that this documentation has been prepared under the direction and in the presence of [name], MD. Electronically Signed: Moises Gardner, [place]. 10/21/2017 , 3:54 PM .  Patient was seen in Room 2 .   Patient ID: Barry Gardner, male    DOB: Sep 19, 1950, 67 y.o.   MRN: [id number] Chief Complaint  Patient presents with  . Medication Refill    Pt request all meds be refilled with a 90 days supply with refills   HPI Barry Gardner is a 67 y.o. male who presents to Primary Care at [facility] for medication refill. He has a continuous glucose monitoring patch in place, which showed sugar around 150 throughout today, until around noon today when it dropped.   His monitor also shows:  His 7-day average sugar is 153.  His 90-day average sugar is 157.  No hypoglycemic episodes.   He plans to have an appointment with Dr. Constance Holster. Patient denies urinary or bowel symptoms. He denies burning in his feet at night, or any sores.   Past Medical History:  Diagnosis Date  . Diabetes mellitus without complication [facility])    Past Surgical History:  Procedure Laterality Date  . CHOLECYSTECTOMY    . GALLBLADDER SURGERY     Prior to Admission medications   Medication Sig Start Date End Date Taking? Authorizing Provider  amLODipine-olmesartan (AZOR) 5-40 MG tablet Take 1 tablet by mouth daily. 06/22/17  Yes Shawnee Knapp, MD  Ascorbic Acid (VITAMIN C) 1000 MG tablet Take 4,000 mg by mouth daily. In the winter; take 2000u daily in the summer.   Yes [provider]  aspirin 81 MG tablet Take 81 mg by mouth daily.   Yes [provider]  atenolol-chlorthalidone (TENORETIC) 100-25 MG tablet Take 1 tablet by mouth daily. 08/24/17  Yes Shawnee Knapp, MD  b complex vitamins capsule Take 1 capsule by mouth daily.   Yes [provider]  Cholecalciferol (VITAMIN D-3) 5000 UNITS TABS Take by mouth.   Yes [provider]    Continuous Gardner Gluc Receiver (FREESTYLE LIBRE READER) [place] USE AS DIRECTED BY OFFICE 06/28/17  Yes [provider]  Continuous Gardner Gluc Sensor ([place]) MISC 1 Units by Does not apply route as needed. To check cbgs at least qid. Dx E11.65, Reason for testing frequency: hyperglycemia, med changes 06/22/17  Yes Shawnee Knapp, MD  glipiZIDE (GLUCOTROL XL) 10 MG 24 hr tablet TAKE 1 TABLET (10 MG TOTAL) BY MOUTH DAILY WITH BREAKFAST. 06/28/17  Yes Shawnee Knapp, MD  Lycopene 25 MG TABS Take 50 mg by mouth daily.    Yes [provider]  multivitamin-iron-minerals-folic acid (CENTRUM) chewable tablet Chew 1 tablet by mouth daily.   Yes [provider]  Omega-3 Fatty Acids (OMEGA 3 500 PO) Take 500 mg by mouth daily.   Yes [provider]  pravastatin (PRAVACHOL) 40 MG tablet Take 1 tablet (40 mg total) by mouth daily. 07/05/17  Yes Shawnee Knapp, MD  Saw Palmetto, Serenoa repens, (SAW PALMETTO PO) Take 540 mg by mouth 3 (three) times daily.    Yes [provider]  tamsulosin (FLOMAX) 0.4 MG CAPS capsule Take 1 capsule (0.4 mg total) by mouth daily. 07/05/17  Yes Shawnee Knapp, MD  XIGDUO XR 2.11-998 MG TB24 TAKE 2 TABLETS BY MOUTH EVERY DAY IN THE MORNING 09/12/17  Yes Shawnee Knapp, MD   No Known  Allergies Family History  Problem Relation Age of Onset  . Diabetes Mother   . Dementia Mother   . Asthma Mother   . Stroke Father   . Aneurysm Father   . Diabetes Brother    Social History   Socioeconomic History  . Marital status: Married    Spouse name: Not on file  . Number of children: Not on file  . Years of education: Not on file  . Highest education level: Not on file  Occupational History  . Not on file  Social Needs  . Financial resource strain: Not on file  . Food insecurity:    Worry: Not on file    Inability: Not on file  . Transportation needs:    Medical: Not on file    Non-medical: Not on file  Tobacco Use  .  Smoking status: Never Smoker  . Smokeless tobacco: Never Used  Substance and Sexual Activity  . Alcohol use: No    Alcohol/week: 0.0 oz  . Drug use: No  . Sexual activity: Not on file  Lifestyle  . Physical activity:    Days per week: Not on file    Minutes per session: Not on file  . Stress: Not on file  Relationships  . Social connections:    Talks on phone: Not on file    Gets together: Not on file    Attends religious service: Not on file    Active member of club or organization: Not on file    Attends meetings of clubs or organizations: Not on file    Relationship status: Not on file  Other Topics Concern  . Not on file  Social History Narrative  . Not on file   Depression screen [facility] 2/9 06/22/2017 11/20/2016 05/20/2016 05/20/2016 05/21/2015  Decreased Interest 0 0 0 0 0  Down, Depressed, Hopeless 0 0 0 0 0  PHQ - 2 Score 0 0 0 0 0    Review of Systems  Constitutional: Positive for appetite change (watching diet - motivated since can easily see what cbg is). Negative for activity change, chills, fatigue, fever and unexpected weight change.  HENT: Negative for sore throat and trouble swallowing.   Eyes: Negative for visual disturbance.  Respiratory: Negative for shortness of breath.   Cardiovascular: Negative for chest pain and leg swelling.  Neurological: Negative for dizziness, syncope, facial asymmetry, weakness, light-headedness and headaches.  Hematological: Positive for adenopathy. Does not bruise/bleed easily.  Psychiatric/Behavioral: Positive for confusion. Negative for dysphoric mood and sleep disturbance. The patient is not nervous/anxious.        Objective:   Physical Exam  Constitutional: He is oriented to person, place, and time. He appears well-developed and well-nourished. No distress.  HENT:  Head: Normocephalic and atraumatic.  Eyes: Pupils are equal, round, and reactive to light. EOM are normal.  Neck: Neck supple.  Soft matted fixed lymph nodes,  posterior to lower right lobe of the thyroid, non tender, skin mobile  Cardiovascular: Normal rate.  Pulmonary/Chest: Effort normal. No respiratory distress.  Musculoskeletal: Normal range of motion.  Neurological: He is alert and oriented to person, place, and time.  Skin: Skin is warm and dry.  Psychiatric: He has a normal mood and affect. His behavior is normal.  Nursing note and vitals reviewed.   BP 138/60 (BP Location: Left Arm, Patient Position: Sitting, Cuff Size: Normal)   Pulse 63   Temp 98.2 F (36.8 C) (Oral)   Resp 18   Ht _0  (  1.778 m)   Wt 215 lb 6.4 oz (97.7 kg)   SpO2 97%   BMI 30.91 kg/m   Results for orders placed or performed in visit on 10/19/17  Comprehensive metabolic panel  Result Value Ref Range   Glucose 152 (H) 65 - 99 mg/dL   BUN 23 8 - 27 mg/dL   Creatinine, Ser 1.17 0.76 - 1.27 mg/dL   GFR calc non Af Amer 64 >59 mL/min/1.73   GFR calc Af Amer 74 >59 mL/min/1.73   BUN/Creatinine Ratio 20 10 - 24   Sodium 143 134 - 144 mmol/L   Potassium 4.3 3.5 - 5.2 mmol/L   Chloride 98 96 - 106 mmol/L   CO2 27 20 - 29 mmol/L   Calcium 9.6 8.6 - 10.2 mg/dL   Total Protein 7.2 6.0 - 8.5 g/dL   Albumin 4.7 3.6 - 4.8 g/dL   Globulin, Total 2.5 1.5 - 4.5 g/dL   Albumin/Globulin Ratio 1.9 1.2 - 2.2   Bilirubin Total 0.2 0.0 - 1.2 mg/dL   Alkaline Phosphatase 50 39 - 117 IU/L   AST 17 0 - 40 IU/L   ALT 16 0 - 44 IU/L  Microalbumin/Creatinine Ratio, Urine  Result Value Ref Range   Creatinine, Urine 207.8 Not Estab. mg/dL   Microalbumin, Urine 14.4 Not Estab. ug/mL   Microalb/Creat Ratio 6.9 0.0 - 30.0 mg/g creat  Hemoglobin A1c  Result Value Ref Range   Hgb A1c MFr Bld 6.6 (H) 4.8 - 5.6 %   Est. average glucose Bld gHb Est-mCnc 143 mg/dL  CBC with Differential/Platelet  Result Value Ref Range   WBC 7.0 3.4 - 10.8 x10E3/uL   RBC 4.58 4.14 - 5.80 x10E6/uL   Hemoglobin 14.4 13.0 - 17.7 g/dL   Hematocrit 41.8 37.5 - 51.0 %   MCV 91 79 - 97 fL   MCH  31.4 26.6 - 33.0 pg   MCHC 34.4 31.5 - 35.7 g/dL   RDW 13.4 12.3 - 15.4 %   Platelets 179 150 - 379 x10E3/uL   Neutrophils 59 Not Estab. %   Lymphs 34 Not Estab. %   Monocytes 7 Not Estab. %   Eos 0 Not Estab. %   Basos 0 Not Estab. %   Neutrophils Absolute 4.1 1.4 - 7.0 x10E3/uL   Lymphocytes Absolute 2.4 0.7 - 3.1 x10E3/uL   Monocytes Absolute 0.5 0.1 - 0.9 x10E3/uL   EOS (ABSOLUTE) 0.0 0.0 - 0.4 x10E3/uL   Basophils Absolute 0.0 0.0 - 0.2 x10E3/uL   Immature Granulocytes 0 Not Estab. %   Immature Grans (Abs) 0.0 0.0 - 0.1 x10E3/uL  TSH+T4F+T3Free  Result Value Ref Range   TSH 1.840 0.450 - 4.500 uIU/mL   T3, Free 3.2 2.0 - 4.4 pg/mL   Free T4 1.17 0.82 - 1.77 ng/dL        Assessment & Plan:  Pt came in for pre-ordered labs 2d ago so that we could have the results for today's visit and the renal fxn/Cr that is needed for his neck CT with contrast sched in an hr.  1. Uncontrolled type 2 diabetes mellitus with hyperglycemia ([place]) - AMAZING CONTROL - truly blown away by how many amazing dietary changes pt was able to make when having the frequent pain free realtime cbg feedback his freestyle libre (10d sensors) allowed him -  At last visit >3 mos prior, we added Farxiga 5 into a combo pill with the metformin XR 2g/d he was already taking (so doing great on xigduo  XR 2.11-998 2 tabs po qam and continue glucotrol XL 10 qsupper.) Lab Results  Component Value Date   HGBA1C 6.6 (H) 10/19/2017   HGBA1C 13.0 06/22/2017   HGBA1C 8.6 11/20/2016   HGBA1C 11.9 05/20/2016   HGBA1C 7.3 12/30/2014  Nml microalb/Cr 2d prior.   2. Essential hypertension, benign -At last visit 4 mos ago we stopped the quinapril and chanted it to the more potent olmesartan and started amlodipine 5.  BP is MUCH improved - no SEx from BP meds, so cont current regimen. Cont same meds and doses: atenolol-chlorthalidone 100-25, amlodipine-olmesartan 5-40. 2d prior nml renal fxn Cr 1.17(baseline 1.0) w/ eGFR 64   3.  Localized swelling, mass or lump of neck   4. Anterior cervical lymphadenopathy - referral was faxed to Dr. Constance Holster (ENT) prior. 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 from FNA biopsy being scheduled asap for tissue diagnosis)  5. Thyroid mass - [place] FNA bx being arranged at [facility]- hopefully for early next wk. Going to neck CT scan immediately after this appt. Nml tsh, ft3, ft4, cbc 2 d prior and 3 mos prior.   6. Hyperlipidemia LDL goal <70 - cont pravastatin 40 which was started a little > 3 mos prior after last LDL - always comes in after work so never can be fasting - 06/2017 direct - LDL 119 so start statin. nml lfts 2d prior, recheck d-ldl or more ideally fasting full lipid panel at next f/u OV     Meds ordered this encounter  Medications  . tamsulosin (FLOMAX) 0.4 MG CAPS capsule    Sig: Take 1 capsule (0.4 mg total) by mouth daily.    Dispense:  90 capsule    Refill:  3  . Dapagliflozin-metFORMIN HCl ER (XIGDUO XR) 2.11-998 MG TB24    Sig: Take 2 tablets by mouth daily.    Dispense:  180 tablet    Refill:  3  . glipiZIDE (GLUCOTROL XL) 10 MG 24 hr tablet    Sig: Take 1 tablet (10 mg total) by mouth daily with breakfast.    Dispense:  90 tablet    Refill:  1  . pravastatin (PRAVACHOL) 40 MG tablet    Sig: Take 1 tablet (40 mg total) by mouth daily.    Dispense:  90 tablet    Refill:  1  . amLODipine-olmesartan (AZOR) 5-40 MG tablet    Sig: Take 1 tablet by mouth daily.    Dispense:  90 tablet    Refill:  1  . atenolol-chlorthalidone (TENORETIC) 100-25 MG tablet    Sig: Take 1 tablet by mouth daily.    Dispense:  90 tablet    Refill:  1    I personally performed the services described in this documentation, which was scribed in my presence. The recorded information has been reviewed and considered, and addended by me as needed.   [name], M.D.    Primary Care at [facility] [address] [place], [place] [address] [phone] phone [phone] fax  10/23/17 3:25 PM

## 2017-10-21 NOTE — Patient Instructions (Signed)
     IF you received an x-ray today, you will receive an invoice from [facility]. Please contact [facility] at [phone] with questions or concerns regarding your invoice.   IF you received labwork today, you will receive an invoice from [facility]. Please contact [facility] at [phone] with questions or concerns regarding your invoice.   Our billing staff will not be able to assist you with questions regarding bills from these companies.  You will be contacted with the lab results as soon as they are available. The fastest way to get your results is to activate your My Chart account. Instructions are located on the last page of this paperwork. If you have not heard from us regarding the results in 2 weeks, please contact this office.     

## 2017-10-23 DIAGNOSIS — R590 Localized enlarged lymph nodes: Secondary | ICD-10-CM | POA: Insufficient documentation

## 2017-10-23 DIAGNOSIS — E079 Disorder of thyroid, unspecified: Secondary | ICD-10-CM | POA: Insufficient documentation

## 2017-10-23 DIAGNOSIS — E785 Hyperlipidemia, unspecified: Secondary | ICD-10-CM | POA: Insufficient documentation

## 2017-10-23 MED ORDER — DAPAGLIFLOZIN PRO-METFORMIN ER 2.5-1000 MG PO TB24: 2.0000 | ORAL_TABLET | Freq: Every day | ORAL | 3 refills | Status: AC

## 2017-10-23 MED ORDER — PRAVASTATIN SODIUM 40 MG PO TABS: 40.0000 mg | ORAL_TABLET | Freq: Every day | ORAL | 1 refills | Status: DC

## 2017-10-23 MED ORDER — AMLODIPINE-OLMESARTAN 5-40 MG PO TABS: 1.0000 | ORAL_TABLET | Freq: Every day | ORAL | 1 refills | Status: DC

## 2017-10-23 MED ORDER — ATENOLOL-CHLORTHALIDONE 100-25 MG PO TABS: 1.0000 | ORAL_TABLET | Freq: Every day | ORAL | 1 refills | Status: DC

## 2017-10-23 MED ORDER — FREESTYLE LIBRE SENSOR SYSTEM MISC: 1.0000 [IU] | 3 refills | Status: DC

## 2017-10-23 MED ORDER — TAMSULOSIN HCL 0.4 MG PO CAPS: 0.4000 mg | ORAL_CAPSULE | Freq: Every day | ORAL | 3 refills | Status: AC

## 2017-10-23 MED ORDER — GLIPIZIDE ER 10 MG PO TB24: 10.0000 mg | ORAL_TABLET | Freq: Every day | ORAL | 1 refills | Status: DC

## 2017-10-23 NOTE — Telephone Encounter (Signed)
Reordered FNA US thyroid and Rt lymph nodes biopsy on 4/10 to go to [facility] but forgot that they don't work their work queues and that I had to let ya'll know that order is in - it says "routine" but would really like this done asap - Imaging shows high concern for metastatic thyroid carcinoma with suspicious thyroid nodules and multiple adjacent lymph nodes. Waiting on FNA biopsy asap to confirm with tissue diagnosis

## 2017-10-24 ENCOUNTER — Telehealth: Payer: Self-pay | Admitting: Family Medicine

## 2017-10-24 DIAGNOSIS — R221 Localized swelling, mass and lump, neck: Secondary | ICD-10-CM

## 2017-10-24 DIAGNOSIS — R590 Localized enlarged lymph nodes: Secondary | ICD-10-CM

## 2017-10-24 DIAGNOSIS — E079 Disorder of thyroid, unspecified: Secondary | ICD-10-CM

## 2017-10-24 NOTE — Telephone Encounter (Signed)
Barry Gardner with [facility] calling and states they do not have any available appt times until 11/08/17. If this is to far out the order would need to be sent to the hospital. [name]'s CB#: [phone]. She may be at lunch but can leave a detailed voicemail.

## 2017-10-24 NOTE — Telephone Encounter (Signed)
Signed order - thanks for placing. I thought [facility] had told us that they couldn't do the FNA - that it had to go to [facility]? (which was odd as I have often ordered FNA bx there in the past. . . )  But now they can do it and want 2 orders instead of just one?   Can they biopsy the enlarged lymph nodes to the right by FNA?  See the 10/19/17 telephone note ->nnie has left several messages with [facility] scheduling on Thurs 4/11 afternoon and early Mon 4/15 a.m. but has not heart back from anyone yet. . .   On the 4/10 telephone note Deneise Lever wrote: "I also tried to get FNA U/S scheduled at [facility] but they are unable to do this type of U/S, and said the hospital would have to do this. However, the order was canceled during trying to schedule with [facility], so we will need a new order placed to get this scheduled. I let [facility] know I would call back to schedule this. Thanks!!" also

## 2017-10-24 NOTE — Telephone Encounter (Signed)
Copied from [place] [id number]. Topic: Quick Communication - See Telephone Encounter >> Oct 24, 2017 11:32 AM Ether Griffins B wrote: CRM for notification. See Telephone encounter for: 10/24/17.  Olin Hauser with [facility] imaging calling requesting another order be placed for pt to have US done. An order is needed for each nodule.

## 2017-10-24 NOTE — Telephone Encounter (Signed)
I called [facility] scheduling last Thursday to get FNA Biopsy scheduled and had to leave a message for their scheduler to call me back. I had not received a call back, so I called again this morning and had to leave another message to get this scheduled. I did leave pt's name and daughter's phone number if they would like to call pt and get this scheduled directly, or to please call me back to get this set up as soon as possible so we can have this to proceed with pt's other referrals, including oncology. I will call back this afternoon if I have not heard anything. Friday is ideal for pt but I believe these biopsies are only scheduled on certain days of the week so pt's daughter said if we have to schedule another day, the latest time possible would be the best and if Thursday is available to try for that day. I will contact pt's daughter as soon as I hear back from [facility].

## 2017-10-24 NOTE — Telephone Encounter (Signed)
Third attempt with [facility] scheduling. Let them know I had called previously and we were needing to schedule U/S this week and I had not yet been called back. Was sent to voicemail again. Will try back tomorrow.

## 2017-10-24 NOTE — Telephone Encounter (Signed)
Left vm for Barry Gardner to let her know we would try to call the hospital for an appt earlier. Still waiting for callback from [facility] scheduling, but will try again today.

## 2017-10-24 NOTE — Telephone Encounter (Signed)
Phone call to [facility] imaging, spoke with Pam. She states another order is needed due to multiple nodules. A duplicate order is fine. Order pended for provider signature.

## 2017-10-24 NOTE — Telephone Encounter (Signed)
Copied from [place] [id number]. Topic: Quick Communication - See Telephone Encounter >> Oct 24, 2017  2:21 PM Robina Ade, Helene Kelp D wrote: CRM for notification. See Telephone encounter for: 10/24/17. Patient would like on the paper work that was dropped today detailed letting his work know that he can leave whenever he doesn't feel well or miss work when he isn't feeling well. Please put a estimate date to when he will have surgery. Also pt Continuous Blood Gluc Sensor ([place]) MISC to be called in to pharmacy please.

## 2017-10-24 NOTE — Telephone Encounter (Signed)
Called [facility] and spoke with [place] to clarify if they are able to do the FNA biopsy and she said they are. I explained to her that I was told last week they could not, but she clarified with Pam to make sure the order was everything they needed to get this scheduled, and it was. She said they will reach out to the patient to get this scheduled. The pt's daughter's phone number is the primary contact number in pt's chart, so this should be the number they are calling. I did let them know we were wanting to get this done this week. So sorry for the confusion. Thanks!

## 2017-10-25 NOTE — Telephone Encounter (Signed)
Barry Gardner attempted to call [facility] today and explained we had been trying to call and were receiving a voicemail each time. She was transferred to vm again and was told only two people can schedule the U/S FNA Biopsies. I tried calling [facility] and they do not do these biopsies, neither does [facility]. I called and spoke with Barry Gardner at [facility] radiology and she said they could do this but needed [place] to send the order, labs, med list, and try to get [facility] to push the U/S Thyroid image through a system called passport so they could pull it to their system. I called [facility] to see if we could do this, and had to leave a message with Med Records. I called Barry Gardner back to let her know we are trying to get this image and she said they will need the image before an appt could be scheduled. She asked for their number and said she will try to get a hold of them as well to see if they can push the image through. I also spoke with Barry Gardner (pt's daughter) and let her know that we are trying to get an appt with [facility]. Barry Gardner stated they may have something Thursday or Friday but will be based on if they receive these records. I told Barry Gardner I will touch base with her tomorrow to update her on the status. She said Friday morning would be preferable for exam. I will call Barry Gardner tomorrow at [phone] to see if she received U/S Thyroid from [place]. I have faxed order, labs, med list, and previous U/S Thyroid report to her at [phone].

## 2017-10-25 NOTE — Telephone Encounter (Signed)
Pt needs FMLA forms completed by Dr Brigitte Pulse, I am not sure what he is needing this for so I have left the forms blank. I will place the forms in Dr Raul Del box on 10/25/17 please return to the FMLA/Disability box at the 102 checkout desk within 5-7 business days. Thank you.

## 2017-10-26 ENCOUNTER — Telehealth: Payer: Self-pay

## 2017-10-26 DIAGNOSIS — E079 Disorder of thyroid, unspecified: Secondary | ICD-10-CM

## 2017-10-26 NOTE — Telephone Encounter (Signed)
AMAZING!  Can someone call Mr. Gail daughter [name] to let her know about appt (the main # listed) and send copy of ins card? Thanks!

## 2017-10-26 NOTE — Telephone Encounter (Signed)
Hoyle Sauer with [facility] is calling and states they got the pt scheduled 10/28/17 @ 9:30am for the biopsy. They need a copy of his BCBS card faxed to them. Fax#: [phone] CB#: [phone]. She also states that [place] in imaging did push the films and she Hoyle Sauer says thank you.

## 2017-10-26 NOTE — Telephone Encounter (Signed)
Pt's daughter is aware of appt. She also wanted to know after U/S when she could get results. Thank you!

## 2017-10-26 NOTE — Telephone Encounter (Signed)
Received call from Dr. Danella Maiers, fellow at [facility].  Received referral for pt.  Per their protocol, need referring provider to submit orders for diagnostic thyroid US.  (Reviewed recent US but provider states he needs new order.)  [place] will allow them to determine which areas need biopsy.  Also needs order for thyroid biopsy.  Have pended orders for your review.  Call back number to Dr. Danella Maiers is [phone].  Reviewed CT but [facility] provider unable to view those results at this time.  Will c/b if he needs [place] to send any info r/t CT.    Please message clinical pool if you have any further questions/needs.  Thank you.

## 2017-10-26 NOTE — Telephone Encounter (Signed)
THat is a VERY good question - they will have to be faxed to [place] on paper - I don't think we will be able to see them in Epic until Barry Gardner shows up for his next appt here which is when he implicitly gives consent for [place] to view his medical record for continuity of care purposes. And I will be out of town since Fri morning until 4/29 and so not there to look out for any papers. Could we request that the results be sent to our office ATTN: ANNIE and then maybe you could call me on my cell phone [phone]), route a telephone call to me, and add that info to the oncology referral as well?  I hope we'd have them by Wed - if not, we should call [facility] and have them faxed again.

## 2017-10-27 ENCOUNTER — Telehealth: Payer: Self-pay | Admitting: Family Medicine

## 2017-10-27 DIAGNOSIS — E1165 Type 2 diabetes mellitus with hyperglycemia: Secondary | ICD-10-CM

## 2017-10-27 NOTE — Telephone Encounter (Signed)
Copied from [place] [id number]. Topic: Quick Communication - See Telephone Encounter >> Oct 27, 2017  4:34 PM Ivar Drape wrote: CRM for notification. See Telephone encounter for: 10/27/17. [facility] w[facility] [employer] [phone] would like to know if they can get prescriptions for the Continuous Blood Gluc Sensor ([place]) MISC with a 14 day reader and 6 14-day sensors, which is a three months supply because the 10-day have been discontinued.

## 2017-10-27 NOTE — Telephone Encounter (Signed)
error 

## 2017-10-27 NOTE — Telephone Encounter (Signed)
Pharmacy is requesting a 3 month supply: Continuous Blood Gluc Sensor ([place]) MISC with a 14 day reader and 6 14-day sensors, which is a three months supply because the 10-day have been discontinued.  Pharmacy: [id number] [place], [place] [phone] (Phone) [phone] (Fax)

## 2017-10-27 NOTE — Telephone Encounter (Signed)
Spoke with Radiology scheduling at [facility] and they gave me the number for Medical Records [phone]) to call and make sure they will fax results of U/S FNA Biopsy to [place] when they are ready. Left vm requesting these results be faxed to [place] with attn: Deneise Lever and provided fax number [phone]. Called pt's daughter [name] and let her know we are hoping to have results by Wednesday or later next week, and she will be contacted when these are received and reviewed.

## 2017-10-28 MED ORDER — FREESTYLE LIBRE 14 DAY READER DEVI: 1.0000 [IU] | Freq: Once | 0 refills | Status: DC

## 2017-10-28 MED ORDER — FREESTYLE LIBRE 14 DAY SENSOR MISC: 1.0000 [IU] | 3 refills | Status: DC

## 2017-10-28 MED ORDER — FREESTYLE LIBRE 14 DAY SENSOR MISC: 1.0000 [IU] | 3 refills | Status: AC

## 2017-10-28 MED ORDER — FREESTYLE LIBRE 14 DAY READER DEVI: 1.0000 [IU] | Freq: Once | 0 refills | Status: AC

## 2017-10-28 NOTE — Telephone Encounter (Signed)
Ok to waive charges for these please - (hate to add insult to injury when someone is already getting bad news). Will return to 102 FMLA file this a.m.  FMLA form is done so that pt can leave or miss work when doesn't feel up to it.  In addition to [facility], there was also a short term disability claim form that apparently can't be submitted before he goes out of work - which will be the "date disability began."  I wrote a date into that box when he was diagnosed as the possibility of cancer (10/17/17) that will be wrong as pt has been working since then so can you please white it out?  Perhaps could you check with [name] (Bolivar's daughter) to see what exactly they want [place] to do with this form right now since Davyd is still working?  Someone needs to hold onto it (NOT me) until he does decide to go out of work for at least several weeks.  Of course, by then likely most of info I wrote on it will be incomplete, out of date, or inaccurate so might just need to start fresh. . .    Unless Keanu wants to go out of work now - which he could do if he wants but I would rec to keep on working until we get some idea of a treatment plan from the specialist so he doesn't run out of FMLA time.

## 2017-10-28 NOTE — Telephone Encounter (Signed)
Spoke with pharmacy. They did receive the rx for glucose reader.

## 2017-10-28 NOTE — Telephone Encounter (Signed)
Sent over requested Dm testing supplies - they DID go by e-rx (first printed, then one sent an error message that it did NOT go through to the PCP rx refill pool but ignore those - the 3rd time it DID get transmitted by e-rx.

## 2017-10-31 ENCOUNTER — Telehealth: Payer: Self-pay | Admitting: Family Medicine

## 2017-10-31 NOTE — Telephone Encounter (Signed)
Glucose meter sent to pharmacy. Barry Gardner, please advise to on recent pt memory impairment.

## 2017-10-31 NOTE — Telephone Encounter (Signed)
Please scheduled for OV for memory impairment.

## 2017-10-31 NOTE — Telephone Encounter (Signed)
Pt's daughter [name] called wanting to let referrals know pt is scheduled with Dr. Constance Holster on 11/08/17. I will send a message to the referrals coordinator at [facility] so that they can schedule an appt for pt after this. She also said pt is starting to forget some things and she wanted to know if this has to do with what is going on with him medically or not. She also wanted to make sure that pt's Freestyle libre 14 day reader and sensor have been sent to the pharmacy. She does not want pt to not be able to check blood sugar. I let her know the telephone messages on 4/19 looked to confirm this, but I would put a message in just in case, and advised her to check with the pharmacy as well. Please advise. Thanks!

## 2017-10-31 NOTE — Telephone Encounter (Signed)
Rose confirmed on 4/19 that pharmacy had new 14d freestyle libre sensor and reader that I had sent by e-rx.  I do not know of any overt reason that he should have memory loss -  Not having hypoglycemic episodes is he?? - that could be cause if he is - of course as he is currently being worked up for possible concern for malignant/metastatic thyroid cancer that is always a concern . Marland Kitchen  But thyroid blood tests/hormone levels are normal.. If memory loss is newly notable,  rec f/u OV asap for a full neurologic exam so we can order an MRI of his head to eval for this and other causes.

## 2017-11-01 NOTE — Telephone Encounter (Signed)
Paperwork scanned and faxed on 11/01/17

## 2017-11-02 NOTE — Telephone Encounter (Signed)
Spoke with pt's daughter Prentice Docker in regards to making pt an appt with Dr. Brigitte Pulse for a full neurologic exam. Daughter says that she would like to wait until they get some test results back and see where they are with the cancer spreading. She stated that he can't take too much time off right now. I advised her to call the office when she felt they were ready to discuss with Dr. Brigitte Pulse.

## 2017-11-03 NOTE — Telephone Encounter (Signed)
Anybody see the biopsy results yet?  The US/biopsy report is available on careeverywhere but no pathology report yet on the tissue results . Marland Kitchen Marland Kitchen

## 2017-11-04 ENCOUNTER — Telehealth: Payer: Self-pay

## 2017-11-04 DIAGNOSIS — C73 Malignant neoplasm of thyroid gland: Secondary | ICD-10-CM

## 2017-11-04 NOTE — Telephone Encounter (Signed)
Called for [place] fine needle biopsy results from [facility] records. LVM advising to fax [place] results of study.

## 2017-11-04 NOTE — Telephone Encounter (Signed)
Please advise 

## 2017-11-04 NOTE — Telephone Encounter (Signed)
Have not received fax with results. [facility] records [phone]) to see if results were available to have faxed over. Spoke with their MR department and they said they could not release results without having pt sign an authorization form to release to Dr. Brigitte Pulse or Dr. Raul Del nurse. I did let them know we were the one who ordered the exam and set appt up so I was unsure how we could not receive the results directly. I spoke with Jenny Reichmann regarding this and he tried calling to get these results over the phone and had to leave a message.

## 2017-11-07 NOTE — Telephone Encounter (Signed)
Still haven't seen results - went through all my faxed papers in my box since my vacation last wk and have not received results yet. Pt has appt w/ [facility] tomorrow so hopefully they will be able to get results - I think they might have access to the [facility]Care Everywhere system . . .  Please call Dr. Janeice Robinson office and let them know where biopsy was done at and that we have not been able to get the results yet and are hoping they will be able to access [facility] records to view them after pt checks in for an appointment.

## 2017-11-08 NOTE — Telephone Encounter (Signed)
I have still not received these yet, can we please try again?  Thanks!

## 2017-11-09 NOTE — Telephone Encounter (Signed)
Called and LVM in medical records to follow up on request.  Hopefully we will hear something soon.

## 2017-11-09 NOTE — Telephone Encounter (Signed)
Medical records, provider needs pathology report from fine needle biopsy done on 10/28/17. Thanks!

## 2017-11-09 NOTE — Telephone Encounter (Signed)
I know that Barry Gardner and Barry Gardner have called the medical records department there.  They state they needed to speak with a nurse AND have the patient fill out a release to have it sent here even though we ordered it............ I'm not sure why they would not release this biopsy to [place] since this would be considered interoffice and we were the ordering practice but I guess that's just their policy.  We have to wait for the patient to fill out a release for this biopsy as far as I know..  I will try and call today to see if I can get anywhere with them.

## 2017-11-10 NOTE — Telephone Encounter (Signed)
This is complete. Results have been given to [facility] and placed in scan.

## 2017-11-12 NOTE — H&P (Signed)
  HPI:   Barry Gardner is a 67 y.o. male who presents as a consult Patient.   Referring Provider: Margarette Canada*  Chief complaint: Neck mass.  HPI: His daughter noticed a neck mass on the right side about a year ago. He recently had CT scan, ultrasound of the thyroid, and bilateral FNA. The FNA results are not yet available. 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 He has no other symptoms related to this. He does not smoke.  PMH/Meds/All/SocHx/FamHx/ROS:   No past medical history on file.  No past surgical history on file.  No family history of bleeding disorders, wound healing problems or difficulty with anesthesia.   Social History   Social History  . Marital status: Married  Spouse name: N/A  . Number of children: N/A  . Years of education: N/A   Occupational History  . Not on file.   Social History Main Topics  . Smoking status: Not on file  . Smokeless tobacco: Not on file  . Alcohol use Not on file  . Drug use: Unknown  . Sexual activity: Not on file   Other Topics Concern  . Not on file   Social History Narrative  . No narrative on file   No current outpatient prescriptions on file.  A complete ROS was performed with pertinent positives/negatives noted in the HPI. The remainder of the ROS are negative.   Physical Exam:   There were no vitals taken for this visit.  General: Healthy and alert, in no distress, breathing easily. Normal affect. In a pleasant mood. Head: Normocephalic, atraumatic. No masses, or scars. Eyes: Pupils are equal, and reactive to light. Vision is grossly intact. No spontaneous or gaze nystagmus. Ears: Ear canals are clear. Tympanic membranes are intact, with normal landmarks and the middle ears are clear and healthy. Hearing: Grossly normal. Nose: Nasal cavities are clear with healthy mucosa, no polyps or exudate. Airways are patent. Face: No masses or scars, facial nerve function is  symmetric. Oral Cavity: No mucosal abnormalities are noted. Tongue with normal mobility. Dentition appears healthy. Oropharynx: Tonsils are symmetric. There are no mucosal masses identified. Tongue base appears normal and healthy. Larynx/Hypopharynx: indirect exam reveals healthy, mobile vocal cords, without mucosal lesions in the hypopharynx or larynx. Chest: Deferred Neck: Right side 4 cm level 4 node, bilateral thyroid enlargement with nodular changes. Neuro: Cranial nerves II-XII with normal function. Balance: Normal gate. Other findings: none.  Independent Review of Additional Tests or Records:   Procedures:  none  Impression & Plans:  Thyroid mass and right side lymphadenopathy, consistent with thyroid cancer. We will await the results of the FNA. Based on the findings, we will make recommendations about possible surgical treatment. Risks and benefits of thyroid surgery and dissection were discussed and all questions were answered. A handout was provided.  FNA suspicious for papillary carcinoma. We will proceed with scheduling surgery.

## 2017-11-15 NOTE — Addendum Note (Signed)
Addended by: Shawnee Knapp on: 11/15/2017 12:12 PM   Modules accepted: Orders

## 2017-11-15 NOTE — Telephone Encounter (Addendum)
Discussed results with pt's daughter [name] on 5/2 - they are worried about possibility of metastasis and how to tell if there could be cancer else where in his body - advised this is usually don't by PET scan but I do not know if it is appropriate in this type of cancer though there is a concern it is metastatic due to enlarged lymphnodes next to thyroid.  Pt's daughter is concerned about lung and brain mets - looks like this is possible that latter is rare - ordered PET scan though best to defer this until oncology eval if that can be obtained initially in a timely fashoin Will place new oncology referral now that pt has seen ENT and FNA biopsy results are available to confirm diagnosis. Pt is schedule for thyroidectomy with lymphnodes removed 5/31.

## 2017-11-25 ENCOUNTER — Encounter (HOSPITAL_COMMUNITY): Payer: Self-pay

## 2017-11-25 ENCOUNTER — Ambulatory Visit (HOSPITAL_COMMUNITY)
Admission: RE | Admit: 2017-11-25 | Discharge: 2017-11-25 | Disposition: A | Discharge status: Home / Self Care | Payer: BLUE CROSS/BLUE SHIELD | Source: Ambulatory Visit | Attending: Anesthesiology | Admitting: Anesthesiology

## 2017-11-25 ENCOUNTER — Encounter (HOSPITAL_COMMUNITY)
Admission: RE | Admit: 2017-11-25 | Discharge: 2017-11-25 | Disposition: A | Discharge status: Home / Self Care | Payer: BLUE CROSS/BLUE SHIELD | Source: Ambulatory Visit | Attending: Otolaryngology | Admitting: Otolaryngology

## 2017-11-25 DIAGNOSIS — Z01810 Encounter for preprocedural cardiovascular examination: Secondary | ICD-10-CM | POA: Insufficient documentation

## 2017-11-25 DIAGNOSIS — Z01812 Encounter for preprocedural laboratory examination: Secondary | ICD-10-CM | POA: Diagnosis not present

## 2017-11-25 DIAGNOSIS — Z01811 Encounter for preprocedural respiratory examination: Secondary | ICD-10-CM

## 2017-11-25 HISTORY — DX: Malignant neoplasm of thyroid gland: C73

## 2017-11-25 HISTORY — DX: Benign prostatic hyperplasia without lower urinary tract symptoms: N40.0

## 2017-11-25 LAB — BASIC METABOLIC PANEL
Anion gap: 9 (ref 5–15)
BUN: 18 mg/dL (ref 6–20)
CALCIUM: 9.7 mg/dL (ref 8.9–10.3)
CO2: 33 mmol/L — AB (ref 22–32)
Chloride: 99 mmol/L — ABNORMAL LOW (ref 101–111)
Creatinine, Ser: 1.04 mg/dL (ref 0.61–1.24)
GFR calc non Af Amer: >60 mL/min (ref >60)
Glucose, Bld: 127 mg/dL — ABNORMAL HIGH (ref 65–99)
Potassium: 3.5 mmol/L (ref 3.5–5.1)
SODIUM: 141 mmol/L (ref 135–145)

## 2017-11-25 LAB — CBC
HCT: 42.3 % (ref 39.0–52.0)
Hemoglobin: 14.4 g/dL (ref 13.0–17.0)
MCH: 30.7 pg (ref 26.0–34.0)
MCHC: 34 g/dL (ref 30.0–36.0)
MCV: 90.2 fL (ref 78.0–100.0)
Platelets: 187 10*3/uL (ref 150–400)
RBC: 4.69 MIL/uL (ref 4.22–5.81)
RDW: 12.4 % (ref 11.5–15.5)
WBC: 7.8 10*3/uL (ref 4.0–10.5)

## 2017-11-25 LAB — GLUCOSE, CAPILLARY: Glucose-Capillary: 136 mg/dL — ABNORMAL HIGH (ref 65–99)

## 2017-11-25 NOTE — Pre-Procedure Instructions (Signed)
Barry Gardner  11/25/2017      [id number] [address], [place] [address] Phone: [phone] Fax: [phone]    Your procedure is scheduled on 12-10-2027 Friday .  Report to [facility] Admitting at 5:30 A.M.   Call this number if you have problems the morning of surgery:  [phone]   Remember:  No food after midnight.  Nothing to drink after midnight                     Take these medicines the morning of surgery with A SIP OF WATER  Amlodipine-olmesartan(Azor) Atenolol-chlorthalidone(Tenoretic)  Follow your doctors instructions regarding your Aspirin. If no instructions were given by your doctor ,then you will need to call the prescribing office to get instructions.  STOP TAKING ANY ASPIRIN(UNLESS OTHERWISE INSTRUCTED BY YOUR SURGEON),ANTIINFLAMATORIES (IBUPROFEN,ALEVE,MOTRIN,ADVIL,GOODY'S POWDERS),HERBAL SUPPLEMENTS,FISH OIL,AND VITAMINS 5-7 DAYS PRIOR TO SURGERY      How to Manage Your Diabetes Before and After Surgery  Why is it important to control my blood sugar before and after surgery? . Improving blood sugar levels before and after surgery helps healing and can limit problems. . A way of improving blood sugar control is eating a healthy diet by: o  Eating less sugar and carbohydrates o  Increasing activity/exercise o  Talking with your doctor about reaching your blood sugar goals . High blood sugars (greater than 180 mg/dL) can raise your risk of infections and slow your recovery, so you will need to focus on controlling your diabetes during the weeks before surgery. . Make sure that the doctor who takes care of your diabetes knows about your planned surgery including the date and location.  How do I manage my blood sugar before surgery? . Check your blood sugar at least 4 times a day, starting 2 days before surgery, to make sure that the level is not too high or low. o Check your  blood sugar the morning of your surgery when you wake up and every 2 hours until you get to the Short Stay unit. . If your blood sugar is less than 70 mg/dL, you will need to treat for low blood sugar: o Do not take insulin. o Treat a low blood sugar (less than 70 mg/dL) with  cup of clear juice (cranberry or apple), 4 glucose tablets, OR glucose gel. Recheck blood sugar in 15 minutes after treatment (to make sure it is greater than 70 mg/dL). If your blood sugar is not greater than 70 mg/dL on recheck, call [phone] o  for further instructions. . Report your blood sugar to the short stay nurse when you get to Short Stay.  . If you are admitted to the hospital after surgery: o Your blood sugar will be checked by the staff and you will probably be given insulin after surgery (instead of oral diabetes medicines) to make sure you have good blood sugar levels. o The goal for blood sugar control after surgery is 80-180 mg/dL.              WHAT DO I DO ABOUT MY DIABETES MEDICATION?   Marland Kitchen Do not take oral diabetes medicines (pills) the morning of surgery.Xigduo-XR  . Marland Kitchen       . .  Other Instructions:          :  :   :  :   Reviewed and Endorsed by [facility] Patient Education Committee, August  2015     Do not wear jewelry.  Do not wear lotions, powders, or perfumes, or deodorant.  Do not shave 48 hours prior to surgery.  Men may shave face and neck.  Do not bring valuables to the hospital.   [facility] is not responsible for any belongings or valuables.  Contacts, dentures or bridgework may not be worn into surgery.  Leave your suitcase in the car.  After surgery it may be brought to your room.  For patients admitted to the hospital, discharge time will be determined by your treatment team.  Patients discharged the day of surgery will not be allowed to drive home.   [facility] - Preparing for Surgery  Before surgery, you can play an important role.  Because  skin is not sterile, your skin needs to be as free of germs as possible.  You can reduce the number of germs on you skin by washing with CHG (chlorahexidine gluconate) soap before surgery.  CHG is an antiseptic cleaner which kills germs and bonds with the skin to continue killing germs even after washing.  Oral Hygiene is also important in reducing the risk of infection.  Remember to brush your teeth the morning of surgery.  Please DO NOT use if you have an allergy to CHG or antibacterial soaps.  If your skin becomes reddened/irritated stop using the CHG and inform your nurse when you arrive at Short Stay.  Do not shave (including legs and underarms) for at least 48 hours prior to the first CHG shower.  You may shave your face.  Please follow these instructions carefully:   1.  Shower with CHG Soap the night before surgery and the morning of Surgery.  2.  If you choose to wash your hair, wash your hair first as usual with your normal shampoo.  3.  After you shampoo, rinse your hair and body thoroughly to remove the shampoo. 4.  Use CHG as you would any other liquid soap.  You can apply chg directly to the skin and wash gently with a      scrungie or washcloth.           5.  Apply the CHG Soap to your body ONLY FROM THE NECK DOWN.   Do not use on open wounds or open sores. Avoid contact with your eyes, ears, mouth and genitals (private parts).  Wash genitals (private parts) with your normal soap.  6.  Wash thoroughly, paying special attention to the area where your surgery will be performed.  7.  Thoroughly rinse your body with warm water from the neck down.  8.  DO NOT shower/wash with your normal soap after using and rinsing off the CHG Soap.  9.  Pat yourself dry with a clean towel.            10.  Wear clean pajamas.            11.  Place clean sheets on your bed the night of your first shower and do not sleep with pets.  Day of Surgery  Do not apply any lotions/deoderants the morning of  surgery.   Please wear clean clothes to the hospital/surgery center. Remember to brush your teeth.    Please read over the following fact sheets that you were given. Pain Booklet and Surgical Site Infection Prevention

## 2017-11-25 NOTE — Progress Notes (Addendum)
Pt. Has Libra system to monitor blood sugars. Daughter states he will be due to change it the day before surgery.  Daughter instructed to call PCP Barry Gardner to see if he can wait until after surgery to place a new one due to the cost.  Daughter states that he will not check his sugars if he has to stick his finger,because he cannot remember .  Reports no history of heart problems and has never seen a cardiologist or had any type of cardiac testing.

## 2017-12-08 ENCOUNTER — Encounter (HOSPITAL_COMMUNITY): Payer: Self-pay | Admitting: Anesthesiology

## 2017-12-08 NOTE — Anesthesia Preprocedure Evaluation (Addendum)
Anesthesia Evaluation  Patient identified by MRN, date of birth, ID band Patient awake    Reviewed: Allergy & Precautions, NPO status , Patient's Chart, lab work & pertinent test results, reviewed documented beta blocker date and time   Airway Mallampati: III  TM Distance: >3 FB Neck ROM: Full    Dental  (+) Edentulous Upper, Edentulous Lower   Pulmonary neg pulmonary ROS,    Pulmonary exam normal breath sounds clear to auscultation       Cardiovascular hypertension, Pt. on medications and Pt. on home beta blockers Normal cardiovascular exam Rhythm:Regular Rate:Normal     Neuro/Psych negative neurological ROS  negative psych ROS   GI/Hepatic negative GI ROS, Neg liver ROS,   Endo/Other  diabetes, Well Controlled, Type 2, Oral Hypoglycemic AgentsThyroid Ca  Renal/GU negative Renal ROS   BPH    Musculoskeletal negative musculoskeletal ROS (+)   Abdominal (+) + obese,   Peds  Hematology Anterior Cervical lymphadenopathy   Anesthesia Other Findings   Reproductive/Obstetrics                          Anesthesia Physical Anesthesia Plan  ASA: III  Anesthesia Plan: General   Post-op Pain Management:    Induction: Intravenous  PONV Risk Score and Plan: 4 or greater and Scopolamine patch - Pre-op, Midazolam, Dexamethasone, Ondansetron and Treatment may vary due to age or medical condition  Airway Management Planned: Oral ETT  Additional Equipment: Arterial line  Intra-op Plan:   Post-operative Plan: Extubation in OR and Possible Post-op intubation/ventilation  Informed Consent: I have reviewed the patients History and Physical, chart, labs and discussed the procedure including the risks, benefits and alternatives for the proposed anesthesia with the patient or authorized representative who has indicated his/her understanding and acceptance.   Dental advisory given  Plan Discussed with:  CRNA and Surgeon  Anesthesia Plan Comments:        Anesthesia Quick Evaluation

## 2017-12-09 ENCOUNTER — Inpatient Hospital Stay (HOSPITAL_COMMUNITY)
Admission: RE | Admit: 2017-12-09 | Discharge: 2017-12-12 | DRG: 627 | Disposition: A | Discharge status: Home / Self Care | Payer: BLUE CROSS/BLUE SHIELD | Source: Ambulatory Visit | Attending: Otolaryngology | Admitting: Otolaryngology

## 2017-12-09 ENCOUNTER — Inpatient Hospital Stay (HOSPITAL_COMMUNITY): Payer: BLUE CROSS/BLUE SHIELD

## 2017-12-09 ENCOUNTER — Inpatient Hospital Stay (HOSPITAL_COMMUNITY): Payer: BLUE CROSS/BLUE SHIELD | Admitting: Certified Registered"

## 2017-12-09 ENCOUNTER — Encounter (HOSPITAL_COMMUNITY): Payer: Self-pay | Admitting: *Deleted

## 2017-12-09 ENCOUNTER — Encounter (HOSPITAL_COMMUNITY)
Admission: RE | Disposition: A | Discharge status: Home / Self Care | Payer: Self-pay | Source: Ambulatory Visit | Attending: Otolaryngology

## 2017-12-09 ENCOUNTER — Inpatient Hospital Stay (HOSPITAL_COMMUNITY): Payer: BLUE CROSS/BLUE SHIELD | Admitting: Emergency Medicine

## 2017-12-09 ENCOUNTER — Other Ambulatory Visit: Payer: Self-pay

## 2017-12-09 DIAGNOSIS — E119 Type 2 diabetes mellitus without complications: Secondary | ICD-10-CM | POA: Diagnosis present

## 2017-12-09 DIAGNOSIS — Z6831 Body mass index (BMI) 31.0-31.9, adult: Secondary | ICD-10-CM

## 2017-12-09 DIAGNOSIS — E669 Obesity, unspecified: Secondary | ICD-10-CM | POA: Diagnosis present

## 2017-12-09 DIAGNOSIS — I10 Essential (primary) hypertension: Secondary | ICD-10-CM | POA: Diagnosis present

## 2017-12-09 DIAGNOSIS — N4889 Other specified disorders of penis: Secondary | ICD-10-CM | POA: Diagnosis present

## 2017-12-09 DIAGNOSIS — C73 Malignant neoplasm of thyroid gland: Principal | ICD-10-CM | POA: Diagnosis present

## 2017-12-09 DIAGNOSIS — N400 Enlarged prostate without lower urinary tract symptoms: Secondary | ICD-10-CM | POA: Diagnosis present

## 2017-12-09 DIAGNOSIS — Z7984 Long term (current) use of oral hypoglycemic drugs: Secondary | ICD-10-CM | POA: Diagnosis not present

## 2017-12-09 DIAGNOSIS — Z79899 Other long term (current) drug therapy: Secondary | ICD-10-CM

## 2017-12-09 DIAGNOSIS — C609 Malignant neoplasm of penis, unspecified: Secondary | ICD-10-CM

## 2017-12-09 HISTORY — PX: MODIFIED RADICAL NECK DISSECTION: SHX2045

## 2017-12-09 HISTORY — DX: Type 2 diabetes mellitus without complications: E11.9

## 2017-12-09 HISTORY — PX: THYROIDECTOMY: SHX17

## 2017-12-09 HISTORY — DX: Family history of other specified conditions: Z84.89

## 2017-12-09 HISTORY — PX: RADICAL NECK DISSECTION: SHX2284

## 2017-12-09 LAB — COMPREHENSIVE METABOLIC PANEL
ALBUMIN: 3.2 g/dL — AB (ref 3.5–5.0)
ALK PHOS: 36 U/L — AB (ref 38–126)
ALT: 22 U/L (ref 17–63)
ANION GAP: 12 (ref 5–15)
AST: 22 U/L (ref 15–41)
BUN: 24 mg/dL — ABNORMAL HIGH (ref 6–20)
CALCIUM: 8.5 mg/dL — AB (ref 8.9–10.3)
CO2: 26 mmol/L (ref 22–32)
CREATININE: 1.18 mg/dL (ref 0.61–1.24)
Chloride: 102 mmol/L (ref 101–111)
GFR calc Af Amer: >60 mL/min (ref >60)
GFR calc non Af Amer: >60 mL/min (ref >60)
GLUCOSE: 214 mg/dL — AB (ref 65–99)
POTASSIUM: 4.1 mmol/L (ref 3.5–5.1)
Sodium: 140 mmol/L (ref 135–145)
TOTAL PROTEIN: 6.1 g/dL — AB (ref 6.5–8.1)
Total Bilirubin: 0.8 mg/dL (ref 0.3–1.2)

## 2017-12-09 LAB — CALCIUM
CALCIUM: 8.7 mg/dL — AB (ref 8.9–10.3)
CALCIUM: 8.4 mg/dL — AB (ref 8.9–10.3)

## 2017-12-09 LAB — GLUCOSE, CAPILLARY
GLUCOSE-CAPILLARY: 163 mg/dL — AB (ref 65–99)
GLUCOSE-CAPILLARY: 244 mg/dL — AB (ref 65–99)
GLUCOSE-CAPILLARY: 218 mg/dL — AB (ref 65–99)
Glucose-Capillary: 209 mg/dL — ABNORMAL HIGH (ref 65–99)
Glucose-Capillary: 226 mg/dL — ABNORMAL HIGH (ref 65–99)

## 2017-12-09 LAB — PROTIME-INR
INR: 1.01
Prothrombin Time: 13.2 seconds (ref 11.4–15.2)

## 2017-12-09 SURGERY — THYROIDECTOMY
Anesthesia: General | Site: Neck | Laterality: Right

## 2017-12-09 MED ORDER — FENTANYL CITRATE (PF) 250 MCG/5ML IJ SOLN
INTRAMUSCULAR | Status: AC
  Filled 2017-12-09: qty 5

## 2017-12-09 MED ORDER — SAW PALMETTO 450 MG PO CAPS: 900.0000 mg | ORAL_CAPSULE | Freq: Two times a day (BID) | ORAL | Status: DC

## 2017-12-09 MED ORDER — OMEGA-3-ACID ETHYL ESTERS 1 G PO CAPS
1.0000 g | ORAL_CAPSULE | Freq: Every day | ORAL | Status: DC
  Administered 2017-12-10 – 2017-12-12 (×3): 1 g via ORAL
  Filled 2017-12-09 (×3): qty 1

## 2017-12-09 MED ORDER — LIDOCAINE-EPINEPHRINE 1 %-1:100000 IJ SOLN
INTRAMUSCULAR | Status: AC
  Filled 2017-12-09: qty 1

## 2017-12-09 MED ORDER — IRBESARTAN 300 MG PO TABS
300.0000 mg | ORAL_TABLET | Freq: Every day | ORAL | Status: DC
  Administered 2017-12-09 – 2017-12-12 (×4): 300 mg via ORAL
  Filled 2017-12-09 (×4): qty 1

## 2017-12-09 MED ORDER — ADULT MULTIVITAMIN W/MINERALS CH
1.0000 | ORAL_TABLET | Freq: Every day | ORAL | Status: DC
  Administered 2017-12-10 – 2017-12-12 (×3): 1 via ORAL
  Filled 2017-12-09 (×3): qty 1

## 2017-12-09 MED ORDER — VITAMIN D 1000 UNITS PO TABS
5000.0000 [IU] | ORAL_TABLET | Freq: Every day | ORAL | Status: DC
  Administered 2017-12-10 – 2017-12-12 (×3): 5000 [IU] via ORAL
  Filled 2017-12-09 (×3): qty 5

## 2017-12-09 MED ORDER — POTASSIUM CHLORIDE 2 MEQ/ML IV SOLN
INTRAVENOUS | Status: DC
  Administered 2017-12-09 – 2017-12-10 (×2): via INTRAVENOUS
  Filled 2017-12-09 (×4): qty 1000

## 2017-12-09 MED ORDER — PRAVASTATIN SODIUM 40 MG PO TABS
40.0000 mg | ORAL_TABLET | Freq: Every day | ORAL | Status: DC
  Administered 2017-12-10 – 2017-12-11 (×3): 40 mg via ORAL
  Filled 2017-12-09 (×3): qty 1

## 2017-12-09 MED ORDER — CANAGLIFLOZIN 100 MG PO TABS
100.0000 mg | ORAL_TABLET | Freq: Every day | ORAL | Status: DC
  Administered 2017-12-10 – 2017-12-11 (×2): 100 mg via ORAL
  Filled 2017-12-09 (×4): qty 1

## 2017-12-09 MED ORDER — LACTATED RINGERS IV SOLN
INTRAVENOUS | Status: DC | PRN
  Administered 2017-12-09 (×2): via INTRAVENOUS

## 2017-12-09 MED ORDER — DAPAGLIFLOZIN PRO-METFORMIN ER 2.5-1000 MG PO TB24: 2.0000 | ORAL_TABLET | Freq: Every day | ORAL | Status: DC

## 2017-12-09 MED ORDER — INSULIN ASPART 100 UNIT/ML ~~LOC~~ SOLN
0.0000 [IU] | Freq: Three times a day (TID) | SUBCUTANEOUS | Status: DC
  Administered 2017-12-09: 5 [IU] via SUBCUTANEOUS
  Administered 2017-12-10 (×3): 3 [IU] via SUBCUTANEOUS
  Administered 2017-12-11 (×2): 2 [IU] via SUBCUTANEOUS
  Administered 2017-12-12: 3 [IU] via SUBCUTANEOUS
  Administered 2017-12-12: 2 [IU] via SUBCUTANEOUS

## 2017-12-09 MED ORDER — ONDANSETRON HCL 4 MG/2ML IJ SOLN
INTRAMUSCULAR | Status: AC
  Filled 2017-12-09: qty 2

## 2017-12-09 MED ORDER — IOHEXOL 300 MG/ML  SOLN
100.0000 mL | Freq: Once | INTRAMUSCULAR | Status: AC | PRN
  Administered 2017-12-09: 100 mL via INTRAVENOUS

## 2017-12-09 MED ORDER — MIDAZOLAM HCL 2 MG/2ML IJ SOLN
INTRAMUSCULAR | Status: AC
  Filled 2017-12-09: qty 2

## 2017-12-09 MED ORDER — BACITRACIN ZINC 500 UNIT/GM EX OINT
1.0000 "application " | TOPICAL_OINTMENT | Freq: Three times a day (TID) | CUTANEOUS | Status: DC
  Administered 2017-12-09 – 2017-12-12 (×9): 1 via TOPICAL
  Filled 2017-12-09 (×2): qty 28.35

## 2017-12-09 MED ORDER — ONDANSETRON HCL 4 MG/2ML IJ SOLN
INTRAMUSCULAR | Status: DC | PRN
  Administered 2017-12-09: 4 mg via INTRAVENOUS

## 2017-12-09 MED ORDER — BACITRACIN ZINC 500 UNIT/GM EX OINT
TOPICAL_OINTMENT | CUTANEOUS | Status: AC
  Filled 2017-12-09: qty 28.35

## 2017-12-09 MED ORDER — GLIPIZIDE ER 10 MG PO TB24
10.0000 mg | ORAL_TABLET | Freq: Every day | ORAL | Status: DC
  Administered 2017-12-10 – 2017-12-11 (×2): 10 mg via ORAL
  Filled 2017-12-09 (×3): qty 1

## 2017-12-09 MED ORDER — 0.9 % SODIUM CHLORIDE (POUR BTL) OPTIME
TOPICAL | Status: DC | PRN
  Administered 2017-12-09: 1000 mL

## 2017-12-09 MED ORDER — FENTANYL CITRATE (PF) 250 MCG/5ML IJ SOLN
INTRAMUSCULAR | Status: DC | PRN
  Administered 2017-12-09 (×8): 50 ug via INTRAVENOUS

## 2017-12-09 MED ORDER — DEXAMETHASONE SODIUM PHOSPHATE 10 MG/ML IJ SOLN
INTRAMUSCULAR | Status: AC
  Filled 2017-12-09: qty 1

## 2017-12-09 MED ORDER — DEXAMETHASONE SODIUM PHOSPHATE 10 MG/ML IJ SOLN
INTRAMUSCULAR | Status: DC | PRN
  Administered 2017-12-09: 10 mg via INTRAVENOUS

## 2017-12-09 MED ORDER — LYCOPENE 10 MG PO CAPS: 50.0000 mg | ORAL_CAPSULE | Freq: Every day | ORAL | Status: DC

## 2017-12-09 MED ORDER — PROPOFOL 10 MG/ML IV BOLUS
INTRAVENOUS | Status: AC
  Filled 2017-12-09: qty 20

## 2017-12-09 MED ORDER — TAMSULOSIN HCL 0.4 MG PO CAPS
0.4000 mg | ORAL_CAPSULE | Freq: Every day | ORAL | Status: DC
  Administered 2017-12-10 – 2017-12-11 (×3): 0.4 mg via ORAL
  Filled 2017-12-09 (×3): qty 1

## 2017-12-09 MED ORDER — PROMETHAZINE HCL 25 MG/ML IJ SOLN: 6.2500 mg | INTRAMUSCULAR | Status: DC | PRN

## 2017-12-09 MED ORDER — AMLODIPINE BESYLATE 5 MG PO TABS
5.0000 mg | ORAL_TABLET | Freq: Every day | ORAL | Status: DC
  Administered 2017-12-09 – 2017-12-12 (×4): 5 mg via ORAL
  Filled 2017-12-09 (×4): qty 1

## 2017-12-09 MED ORDER — HYDROCODONE-ACETAMINOPHEN 7.5-325 MG PO TABS: 1.0000 | ORAL_TABLET | Freq: Four times a day (QID) | ORAL | 0 refills | Status: AC | PRN

## 2017-12-09 MED ORDER — LIDOCAINE 2% (20 MG/ML) 5 ML SYRINGE
INTRAMUSCULAR | Status: AC
  Filled 2017-12-09: qty 5

## 2017-12-09 MED ORDER — MEGARED OMEGA-3 KRILL OIL 500 MG PO CAPS: ORAL_CAPSULE | Freq: Every day | ORAL | Status: DC

## 2017-12-09 MED ORDER — SUGAMMADEX SODIUM 200 MG/2ML IV SOLN
INTRAVENOUS | Status: AC
  Filled 2017-12-09: qty 2

## 2017-12-09 MED ORDER — METFORMIN HCL ER 500 MG PO TB24
2000.0000 mg | ORAL_TABLET | Freq: Every day | ORAL | Status: DC
  Filled 2017-12-09: qty 4

## 2017-12-09 MED ORDER — LEVOTHYROXINE SODIUM 100 MCG PO TABS
100.0000 ug | ORAL_TABLET | Freq: Every day | ORAL | Status: DC
  Administered 2017-12-10 – 2017-12-12 (×3): 100 ug via ORAL
  Filled 2017-12-09 (×3): qty 1

## 2017-12-09 MED ORDER — VITAMIN C 500 MG PO TABS
2000.0000 mg | ORAL_TABLET | Freq: Every day | ORAL | Status: DC
Start: 2017-12-10 — End: 2017-12-12
  Administered 2017-12-10 – 2017-12-12 (×3): 2000 mg via ORAL
  Filled 2017-12-09 (×3): qty 4

## 2017-12-09 MED ORDER — HYDROCODONE-ACETAMINOPHEN 5-325 MG PO TABS
1.0000 | ORAL_TABLET | ORAL | Status: DC | PRN
  Administered 2017-12-09: 1 via ORAL
  Filled 2017-12-09: qty 1

## 2017-12-09 MED ORDER — MIDAZOLAM HCL 5 MG/5ML IJ SOLN
INTRAMUSCULAR | Status: DC | PRN
  Administered 2017-12-09: 2 mg via INTRAVENOUS

## 2017-12-09 MED ORDER — SUGAMMADEX SODIUM 200 MG/2ML IV SOLN
INTRAVENOUS | Status: DC | PRN
  Administered 2017-12-09: 200 mg via INTRAVENOUS

## 2017-12-09 MED ORDER — SODIUM CHLORIDE 0.9 % IJ SOLN
INTRAMUSCULAR | Status: AC
  Filled 2017-12-09: qty 10

## 2017-12-09 MED ORDER — BACITRACIN ZINC 500 UNIT/GM EX OINT
TOPICAL_OINTMENT | CUTANEOUS | Status: DC | PRN
  Administered 2017-12-09: 1 via TOPICAL

## 2017-12-09 MED ORDER — FREESTYLE LIBRE 14 DAY SENSOR MISC: 1.0000 [IU] | Status: DC

## 2017-12-09 MED ORDER — PHENYLEPHRINE HCL 10 MG/ML IJ SOLN
INTRAVENOUS | Status: DC | PRN
  Administered 2017-12-09: 10 ug/min via INTRAVENOUS

## 2017-12-09 MED ORDER — ASPIRIN EC 81 MG PO TBEC
81.0000 mg | DELAYED_RELEASE_TABLET | Freq: Every day | ORAL | Status: DC
  Administered 2017-12-09 – 2017-12-12 (×4): 81 mg via ORAL
  Filled 2017-12-09 (×4): qty 1

## 2017-12-09 MED ORDER — ATENOLOL-CHLORTHALIDONE 100-25 MG PO TABS: 1.0000 | ORAL_TABLET | Freq: Every day | ORAL | Status: DC

## 2017-12-09 MED ORDER — LACTATED RINGERS IV SOLN
INTRAVENOUS | Status: DC | PRN
  Administered 2017-12-09: 07:00:00 via INTRAVENOUS

## 2017-12-09 MED ORDER — ROCURONIUM BROMIDE 10 MG/ML (PF) SYRINGE
PREFILLED_SYRINGE | INTRAVENOUS | Status: AC
  Filled 2017-12-09: qty 5

## 2017-12-09 MED ORDER — B COMPLEX-C PO TABS
1.0000 | ORAL_TABLET | Freq: Every day | ORAL | Status: DC
  Administered 2017-12-10 – 2017-12-12 (×3): 1 via ORAL
  Filled 2017-12-09 (×3): qty 1

## 2017-12-09 MED ORDER — MEPERIDINE HCL 50 MG/ML IJ SOLN: 6.2500 mg | INTRAMUSCULAR | Status: DC | PRN

## 2017-12-09 MED ORDER — SUPER B COMPLEX/C PO CAPS: ORAL_CAPSULE | Freq: Every day | ORAL | Status: DC

## 2017-12-09 MED ORDER — EPHEDRINE SULFATE 50 MG/ML IJ SOLN
INTRAMUSCULAR | Status: AC
  Filled 2017-12-09: qty 1

## 2017-12-09 MED ORDER — PROMETHAZINE HCL 25 MG RE SUPP
25.0000 mg | Freq: Four times a day (QID) | RECTAL | Status: DC | PRN
Start: 2017-12-09 — End: 2017-12-12
  Filled 2017-12-09: qty 1

## 2017-12-09 MED ORDER — PROMETHAZINE HCL 25 MG RE SUPP: 25.0000 mg | Freq: Four times a day (QID) | RECTAL | 1 refills | Status: AC | PRN

## 2017-12-09 MED ORDER — EPHEDRINE SULFATE 50 MG/ML IJ SOLN
INTRAMUSCULAR | Status: DC | PRN
  Administered 2017-12-09 (×2): 5 mg via INTRAVENOUS

## 2017-12-09 MED ORDER — LIDOCAINE 2% (20 MG/ML) 5 ML SYRINGE
INTRAMUSCULAR | Status: DC | PRN
  Administered 2017-12-09: 60 mg via INTRAVENOUS

## 2017-12-09 MED ORDER — PROPOFOL 10 MG/ML IV BOLUS
INTRAVENOUS | Status: DC | PRN
  Administered 2017-12-09: 150 mg via INTRAVENOUS

## 2017-12-09 MED ORDER — SCOPOLAMINE 1 MG/3DAYS TD PT72
MEDICATED_PATCH | TRANSDERMAL | Status: AC
  Filled 2017-12-09: qty 1

## 2017-12-09 MED ORDER — ROCURONIUM BROMIDE 10 MG/ML (PF) SYRINGE
PREFILLED_SYRINGE | INTRAVENOUS | Status: DC | PRN
  Administered 2017-12-09: 60 mg via INTRAVENOUS

## 2017-12-09 MED ORDER — HYDROMORPHONE HCL 2 MG/ML IJ SOLN: 0.2500 mg | INTRAMUSCULAR | Status: DC | PRN

## 2017-12-09 MED ORDER — PROMETHAZINE HCL 25 MG PO TABS
25.0000 mg | ORAL_TABLET | Freq: Four times a day (QID) | ORAL | Status: DC | PRN
  Administered 2017-12-09: 25 mg via ORAL
  Filled 2017-12-09: qty 1

## 2017-12-09 MED ORDER — AMLODIPINE-OLMESARTAN 5-40 MG PO TABS: 1.0000 | ORAL_TABLET | Freq: Every day | ORAL | Status: DC

## 2017-12-09 MED ORDER — LEVOTHYROXINE SODIUM 100 MCG PO TABS: 100.0000 ug | ORAL_TABLET | Freq: Every day | ORAL | 6 refills | Status: DC

## 2017-12-09 MED ORDER — CHLORTHALIDONE 25 MG PO TABS
25.0000 mg | ORAL_TABLET | Freq: Every day | ORAL | Status: DC
  Administered 2017-12-10 – 2017-12-12 (×2): 25 mg via ORAL
  Filled 2017-12-09 (×3): qty 1

## 2017-12-09 MED ORDER — ATENOLOL 50 MG PO TABS
100.0000 mg | ORAL_TABLET | Freq: Every day | ORAL | Status: DC
  Administered 2017-12-10 – 2017-12-12 (×3): 100 mg via ORAL
  Filled 2017-12-09 (×3): qty 2

## 2017-12-09 SURGICAL SUPPLY — 61 items
ADH SKN CLS APL DERMABOND .7 (GAUZE/BANDAGES/DRESSINGS) ×1
APPLIER CLIP 9.375 SM OPEN (CLIP)
APR CLP SM 9.3 20 MLT OPN (CLIP)
BLADE SURG 15 STRL LF DISP TIS (BLADE) IMPLANT
BLADE SURG 15 STRL SS (BLADE)
CANISTER SUCT 3000ML PPV (MISCELLANEOUS) ×3 IMPLANT
CLEANER TIP ELECTROSURG 2X2 (MISCELLANEOUS) ×3 IMPLANT
CLIP APPLIE 9.375 SM OPEN (CLIP) IMPLANT
CONT SPEC 4OZ CLIKSEAL STRL BL (MISCELLANEOUS) ×3 IMPLANT
CORDS BIPOLAR (ELECTRODE) ×3 IMPLANT
COVER SURGICAL LIGHT HANDLE (MISCELLANEOUS) ×3 IMPLANT
DERMABOND ADVANCED (GAUZE/BANDAGES/DRESSINGS) ×2
DERMABOND ADVANCED .7 DNX12 (GAUZE/BANDAGES/DRESSINGS) ×1 IMPLANT
DRAIN CHANNEL 15F RND FF W/TCR (WOUND CARE) IMPLANT
DRAIN HEMOVAC 7FR (DRAIN) ×2 IMPLANT
DRAIN SNY 10 ROU (WOUND CARE) IMPLANT
DRAIN WOUND SNY 15 RND (WOUND CARE) IMPLANT
DRAPE HALF SHEET 40X57 (DRAPES) IMPLANT
DRAPE INCISE 23X17 IOBAN STRL (DRAPES)
DRAPE INCISE 23X17 STRL (DRAPES) IMPLANT
DRAPE INCISE IOBAN 23X17 STRL (DRAPES) IMPLANT
ELECT COATED BLADE 2.86 ST (ELECTRODE) ×3 IMPLANT
ELECT REM PT RETURN 9FT ADLT (ELECTROSURGICAL) ×3
ELECTRODE REM PT RTRN 9FT ADLT (ELECTROSURGICAL) ×1 IMPLANT
EVACUATOR SILICONE 100CC (DRAIN) ×3 IMPLANT
FORCEPS BIPOLAR SPETZLER 8 1.0 (NEUROSURGERY SUPPLIES) ×3 IMPLANT
GAUZE SPONGE 4X4 16PLY XRAY LF (GAUZE/BANDAGES/DRESSINGS) ×5 IMPLANT
GLOVE BIO SURGEON STRL SZ 6.5 (GLOVE) ×2 IMPLANT
GLOVE BIO SURGEONS STRL SZ 6.5 (GLOVE) ×1
GLOVE ECLIPSE 7.5 STRL STRAW (GLOVE) ×3 IMPLANT
GOWN STRL REUS W/ TWL LRG LVL3 (GOWN DISPOSABLE) ×2 IMPLANT
GOWN STRL REUS W/TWL LRG LVL3 (GOWN DISPOSABLE) ×6
GUIDEWIRE STR DUAL SENSOR (WIRE) ×2 IMPLANT
KIT BASIN OR (CUSTOM PROCEDURE TRAY) ×3 IMPLANT
KIT TURNOVER KIT B (KITS) ×3 IMPLANT
LOCATOR NERVE 3 VOLT (DISPOSABLE) IMPLANT
NDL PRECISIONGLIDE 27X1.5 (NEEDLE) ×1 IMPLANT
NEEDLE PRECISIONGLIDE 27X1.5 (NEEDLE) ×3 IMPLANT
NS IRRIG 1000ML POUR BTL (IV SOLUTION) ×3 IMPLANT
PAD ARMBOARD 7.5X6 YLW CONV (MISCELLANEOUS) ×6 IMPLANT
PENCIL FOOT CONTROL (ELECTRODE) ×3 IMPLANT
SHEARS HARMONIC 9CM CVD (BLADE) ×3 IMPLANT
SPECIMEN JAR MEDIUM (MISCELLANEOUS) IMPLANT
SPONGE INTESTINAL PEANUT (DISPOSABLE) ×2 IMPLANT
SPONGE LAP 18X18 X RAY DECT (DISPOSABLE) IMPLANT
STAPLER VISISTAT 35W (STAPLE) ×5 IMPLANT
SUT CHROMIC 3 0 PS 2 (SUTURE) ×5 IMPLANT
SUT CHROMIC 3 0 SH 27 (SUTURE) ×3 IMPLANT
SUT CHROMIC 4 0 PS 2 18 (SUTURE) IMPLANT
SUT CHROMIC 5 0 P 3 (SUTURE) IMPLANT
SUT ETHILON 3 0 PS 1 (SUTURE) ×3 IMPLANT
SUT ETHILON 5 0 PS 2 18 (SUTURE) IMPLANT
SUT SILK 2 0 REEL (SUTURE) IMPLANT
SUT SILK 3 0 REEL (SUTURE) IMPLANT
SUT SILK 3 0 SH CR/8 (SUTURE) ×3 IMPLANT
SUT SILK 4 0 REEL (SUTURE) ×5 IMPLANT
SUT VIC AB 3-0 SH 18 (SUTURE) IMPLANT
TOWEL OR 17X24 6PK STRL BLUE (TOWEL DISPOSABLE) ×3 IMPLANT
TRAY ENT MC OR (CUSTOM PROCEDURE TRAY) ×3 IMPLANT
TRAY FOLEY MTR SLVR 14FR STAT (SET/KITS/TRAYS/PACK) IMPLANT
TUBE FEEDING 10FR FLEXIFLO (MISCELLANEOUS) IMPLANT

## 2017-12-09 NOTE — Progress Notes (Signed)
Pt new admit from PACU s/p right neck thyroidectomy with left neck JP drain , 02 [place] 2 liters/min, SCD, peripheral IV with LR at 75, voided 200 ml, head elevated 30 degrees.

## 2017-12-09 NOTE — Interval H&P Note (Signed)
History and Physical Interval Note:  12/09/2017 7:21 AM  Barry Gardner  has presented today for surgery, with the diagnosis of Thyroid mass and right side lymphadenopathy  The various methods of treatment have been discussed with the patient and family. After consideration of risks, benefits and other options for treatment, the patient has consented to  Procedure(s) with comments: THYROIDECTOMY (N/A) RADICAL NECK DISSECTION (Right) - modified neck dissection as a surgical intervention .  The patient's history has been reviewed, patient examined, no change in status, stable for surgery.  I have reviewed the patient's chart and labs.  Questions were answered to the patient's satisfaction.     Barry Gardner

## 2017-12-09 NOTE — OR Nursing (Signed)
Dr Alyson Ingles attempted to start foley

## 2017-12-09 NOTE — Anesthesia Postprocedure Evaluation (Signed)
Anesthesia Post Note  Patient: Barry Gardner  Procedure(s) Performed: THYROIDECTOMY (Right Neck) RADICAL NECK DISSECTION (Right Neck)     Patient location during evaluation: PACU Anesthesia Type: General Level of consciousness: awake and alert and oriented Pain management: pain level controlled Vital Signs Assessment: post-procedure vital signs reviewed and stable Respiratory status: spontaneous breathing, nonlabored ventilation, respiratory function stable and patient connected to nasal cannula oxygen Cardiovascular status: blood pressure returned to baseline and stable Postop Assessment: no apparent nausea or vomiting Anesthetic complications: no    Last Vitals:  Vitals:   12/09/17 1120 12/09/17 1205  BP: (!) 127/97 135/74  Pulse: 72 68  Resp: 15 (!) 6  Temp:  (!) 36.2 C  SpO2: 95% 94%    Last Pain:  Vitals:   12/09/17 1205  TempSrc:   PainSc: 3                  Domnick Chervenak A.

## 2017-12-09 NOTE — Transfer of Care (Signed)
Immediate Anesthesia Transfer of Care Note  Patient: Barry Gardner  Procedure(s) Performed: THYROIDECTOMY (Right Neck) RADICAL NECK DISSECTION (Right Neck)  Patient Location: PACU  Anesthesia Type:General  Level of Consciousness: awake  Airway & Oxygen Therapy: Patient Spontanous Breathing and Patient connected to nasal cannula oxygen  Post-op Assessment: Report given to RN and Post -op Vital signs reviewed and stable  Post vital signs: Reviewed and stable  Last Vitals:  Vitals Value Taken Time  BP 145/90 12/09/2017 11:06 AM  Temp    Pulse 75 12/09/2017 11:06 AM  Resp 21 12/09/2017 11:06 AM  SpO2 94 % 12/09/2017 11:06 AM  Vitals shown include unvalidated device data.  Last Pain:  Vitals:   12/09/17 1050  TempSrc:   PainSc: (P) 0-No pain      Patients Stated Pain Goal: 3 (81/77/11 6579)  Complications: No apparent anesthesia complications

## 2017-12-09 NOTE — Consult Note (Addendum)
Urology Consult  Referring physician: Dr. Constance Holster Reason for referral: Penile mass and difficult foley placement  Chief Complaint: Penile Mass  History of Present Illness: Barry Gardner is a 67yo with a hx of BPH, DMII, and thyroid cancer who was found to have a penile mass when foley was attempted to be placed for his neck dissection. A foley was attempted in the OR by urology which was unsuccessful. I talking with the patient he does not know that he has a large penile mass and he denies any bleeding from the mas. He denies any issues urinating at this time. He denies any fevers/chills/sweats.  CT abd/pelvis shows bilateral inguinal lymphadenopathy without any pelvic lymphadenopathy  Past Medical History:  Diagnosis Date  . BPH (benign prostatic hyperplasia)   . Family history of adverse reaction to anesthesia    /daughter "I get aggressive when I wake up" (12/09/2017)  . Hypertension   . Thyroid cancer ([place]) 2019  . Type II diabetes mellitus ([place])    Past Surgical History:  Procedure Laterality Date  . LAPAROSCOPIC CHOLECYSTECTOMY  2000s  . LYMPH NODE BIOPSY Right ~ 10/2017   "@ [place]"  . MODIFIED RADICAL NECK DISSECTION Right 12/09/2017  . THYROIDECTOMY Right 12/09/2017    Medications: I have reviewed the patient's current medications. Allergies: No Known Allergies  Family History  Problem Relation Age of Onset  . Diabetes Mother   . Dementia Mother   . Asthma Mother   . Stroke Father   . Aneurysm Father   . Diabetes Brother    Social History:  reports that he has never smoked. He has never used smokeless tobacco. He reports that he does not drink alcohol or use drugs.  Review of Systems  All other systems reviewed and are negative.   Physical Exam:  Vital signs in last 24 hours: Temp:  [97.2 F (36.2 C)-98.8 F (37.1 C)] 98.8 F (37.1 C) (05/31 2047) Pulse Rate:  [68-74] 71 (05/31 2047) Resp:  [6-18] 18 (05/31 2047) BP: (127-151)/(74-97) 150/79 (05/31 2047) SpO2:   [91 %-96 %] 94 % (05/31 2047) Arterial Line BP: (158-161)/(60-63) 161/60 (05/31 1135) Weight:  [99.3 kg (219 lb)] 99.3 kg (219 lb) (05/31 0550) Physical Exam  Constitutional: He is oriented to person, place, and time. He appears well-developed and well-nourished.  HENT:  Head: Normocephalic and atraumatic.  Eyes: Pupils are equal, round, and reactive to light. EOM are normal.  Neck: Normal range of motion. No tracheal deviation present. No thyromegaly present.  Cardiovascular: Normal rate and regular rhythm.  Respiratory: Effort normal. No respiratory distress.  GI: Soft. He exhibits no distension and no mass. There is no tenderness. There is no rebound and no guarding. Hernia confirmed negative in the right inguinal area and confirmed negative in the left inguinal area.  Genitourinary: Testes normal.  Genitourinary Comments: 6cm mass involving glans and distal penile shaft. Palpable bilateral inguinal lymphadenopathy  Musculoskeletal: Normal range of motion. He exhibits no edema.  Lymphadenopathy:       Right: Inguinal adenopathy present.       Left: Inguinal adenopathy present.  Neurological: He is alert and oriented to person, place, and time.  Skin: Skin is warm and dry.  Psychiatric: He has a normal mood and affect. His behavior is normal. Judgment and thought content normal.    Laboratory Data:  Results for orders placed or performed during the hospital encounter of 12/09/17 (from the past 72 hour(s))  Glucose, capillary     Status: Abnormal  Collection Time: 12/09/17  5:44 AM  Result Value Ref Range   Glucose-Capillary 163 (H) 65 - 99 mg/dL  Glucose, capillary     Status: Abnormal   Collection Time: 12/09/17 10:50 AM  Result Value Ref Range   Glucose-Capillary 209 (H) 65 - 99 mg/dL   Comment 1 Notify RN   Protime-INR     Status: None   Collection Time: 12/09/17 12:27 PM  Result Value Ref Range   Prothrombin Time 13.2 11.4 - 15.2 seconds   INR 1.01     Comment: Performed  at [facility], [place] [address] [place], [place] [address]  Calcium     Status: Abnormal   Collection Time: 12/09/17 12:27 PM  Result Value Ref Range   Calcium 8.7 (L) 8.9 - 10.3 mg/dL    Comment: Performed at [facility], [place] [address] [place],  [address]  Glucose, capillary     Status: Abnormal   Collection Time: 12/09/17  5:22 PM  Result Value Ref Range   Glucose-Capillary 244 (H) 65 - 99 mg/dL  Glucose, capillary     Status: Abnormal   Collection Time: 12/09/17  8:47 PM  Result Value Ref Range   Glucose-Capillary 226 (H) 65 - 99 mg/dL   No results found for this or any previous visit (from the past 240 hour(s)). Creatinine: No results for input(s): CREATININE in the last 168 hours. 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 Penile mass: We discussed the natural history of penile malignancies and the treatment options including partial versus total penectomy. We also discussed his inguinal lymphadenopathy and the recommendation for inguinal node biopsy for staging purposes. We will consult IR for inguinal lymph node biopsy. The patient is currently in denial about his penile mass and the likelihood this is cancer.   Nicolette Bang 12/09/2017, 9:21 PM

## 2017-12-09 NOTE — Progress Notes (Signed)
12/09/2017 5:57 PM  Barry Gardner [id number]  Post-Op Check   Temp:  [97.2 F (36.2 C)-98.6 F (37 C)] 98.6 F (37 C) (05/31 1725) Pulse Rate:  [68-74] 74 (05/31 1725) Resp:  [6-18] 16 (05/31 1725) BP: (127-151)/(74-97) 147/75 (05/31 1725) SpO2:  [91 %-96 %] 93 % (05/31 1725) Arterial Line BP: (158-161)/(60-63) 161/60 (05/31 1135) Weight:  [99.3 kg (219 lb)] 99.3 kg (219 lb) (05/31 0550),     Intake/Output Summary (Last 24 hours) at 12/09/2017 1757 Last data filed at 12/09/2017 1700 Gross per 24 hour  Intake 2095 ml  Output 900 ml  Net 1195 ml    Results for orders placed or performed during the hospital encounter of 12/09/17 (from the past 24 hour(s))  Glucose, capillary     Status: Abnormal   Collection Time: 12/09/17  5:44 AM  Result Value Ref Range   Glucose-Capillary 163 (H) 65 - 99 mg/dL  Glucose, capillary     Status: Abnormal   Collection Time: 12/09/17 10:50 AM  Result Value Ref Range   Glucose-Capillary 209 (H) 65 - 99 mg/dL   Comment 1 Notify RN   Protime-INR     Status: None   Collection Time: 12/09/17 12:27 PM  Result Value Ref Range   Prothrombin Time 13.2 11.4 - 15.2 seconds   INR 1.01   Calcium     Status: Abnormal   Collection Time: 12/09/17 12:27 PM  Result Value Ref Range   Calcium 8.7 (L) 8.9 - 10.3 mg/dL  Glucose, capillary     Status: Abnormal   Collection Time: 12/09/17  5:22 PM  Result Value Ref Range   Glucose-Capillary 244 (H) 65 - 99 mg/dL    SUBJECTIVE:  Min-mod pain.  No breathing or swallowing difficulty.  Able to void.    OBJECTIVE:  Voice gravelly.  Neck flat.  Drain functional .  IMPRESSION:  Satisfactory check  PLAN:   CT abd/pelvis ordered.  Will postpone suprapubic catheter placement in light of the fact that he is able to void seemingly adequately.  Urology will make the final call on this issue.  Advance diet and activity.    Jodi Marble

## 2017-12-09 NOTE — Anesthesia Procedure Notes (Signed)
Arterial Line Insertion Start/End5/31/2019 7:05 AM, 12/09/2017 7:15 AM Performed by: Harden Mo, CRNA, CRNA  Patient location: Pre-op. Preanesthetic checklist: patient identified, IV checked, site marked, risks and benefits discussed, surgical consent, monitors and equipment checked, pre-op evaluation and anesthesia consent Patient sedated Left, radial was placed Catheter size: 20 G Hand hygiene performed  and maximum sterile barriers used   Attempts: 1 Procedure performed without using ultrasound guided technique. Following insertion, dressing applied and Biopatch. Post procedure assessment: normal  Patient tolerated the procedure well with no immediate complications.

## 2017-12-09 NOTE — Op Note (Signed)
OPERATIVE REPORT  DATE OF SURGERY: 12/09/2017  PATIENT:  Barry Gardner,  67 y.o. male  PRE-OPERATIVE DIAGNOSIS:  Thyroid mass and right side lymphadenopathy  POST-OPERATIVE DIAGNOSIS:  Thyroid mass and right side lymphadenopathy  PROCEDURE:  Procedure(s): THYROIDECTOMY Selective NECK DISSECTION  SURGEON:  Beckie Salts, MD  ASSISTANTS: Jolene Provost PA  ANESTHESIA:   General   EBL:  100 ml  DRAINS: 10 French round drain  LOCAL MEDICATIONS USED:  None  SPECIMEN:  Total thyroidectomy, central node dissection, right selective neck dissection involving levels 2 through 5.  COUNTS:  Correct  PROCEDURE DETAILS: The patient was taken to the operating room and placed on the operating table in the supine position. Following induction of general endotracheal anesthesia, the neck was prepped and draped in a standard fashion. A transverse incision was outlined in a lower cervical skin crease with extension up along the right side of the neck up to the mastoid tip area. Electrocautery was used to incise the skin and subcutaneous tissue and platysma layer. Subplatysmal flaps were elevated superiorly and inferiorly.  Total thyroidectomy with central node dissection. The midline fascia was divided exposing the thyroid gland. The strap muscles were reflected laterally off the left lobe. The left lobe was enlarged but soft. There may have been a small firm mass within this. The superior vasculature was identified and ligated between clamps. Harmonic dissector was used as well throughout the case. The recurrent nerve was identified and was preserved. A putative superior parathyroid was also identified and preserved with its blood supply on the left side. The remaining thyroid lobe was brought forward. The middle thyroid vein was divided. The inferior vasculature was divided as well. The gland was brought up off the trachea. Berry's ligament was divided exposing the trachea. There are multiple  anterior compartment nodes identified and these were kept en bloc with the thyroid specimen.  The right lobe was dissected at the end of the case after the neck dissection was completed. Due to the extent of the disease and the adenopathy the strap muscles were sacrificed on the right side. The recurrent nerve was identified and preserved. There is a hard 2 cm mass involving the right lobe. There is hard dark-colored adenopathy just adjacent to that laterally. The inferior vasculature was ligated between clamps and divided. The gland was dissected off the trachea and brought towards the neck dissection specimen. There is no adhesion to the trachea. Recurrent nerves were intact bilaterally. All anterior compartment lymph nodes were brought with the specimen.  Right selective neck dissection. Multiple level II, 3 and 4 nodes as well as significant adenopathy in level V were identified. The tail of the parotid was identified and reflected superiorly. Dissection continued through the external jugular vein which was sacrificed. The greater auricular nerve was also sacrificed. The posterior belly digastric was identified. The accessory nerve and hypoglossal nerves were both identified as was the internal jugular vein. All these structures were preserved. The posterior superior lymph node bearing tissue was cleaned off of the splenius capitis and levator scapulae muscles and brought under the spinal accessory nerve. Due to the extent of the level V dissect adenopathy, the cervical cutaneous branches were sacrificed. The phrenic was preserved. All of the lower level V nodes were taken and brought forward. Inferiorly there were some lymphatic branches that were ligated between clamps and divided. Specimen was then brought forward and dissected off of the internal jugular vein. The internal jugular vein, the vagus and the  carotid system were all preserved. The specimen was brought forward towards the strap muscles and  towards the thyroidectomy specimen. The entire specimen was delivered. The specimen was oriented on a towel and sent for pathologic evaluation.  The wound was irrigated with saline. Valsalva maneuver was used to assess for any bleeding or any chyle leak and there was none. The drain was exited through separate stab incision on the left side inferiorly and was placed in position to cover the entire surgical bed. This was secured in place with nylon suture. The incision was reapproximated in 2 layers using interrupted 3-0 chromic on the platysma layer and skin staples on the skin. The drain was charged. There is no leak. Bacitracin was applied.  Note: At the beginning of the case a Foley catheter was to be placed in the bladder. The patient had a large growth on the end of his penis. Urology was consulted intraoperatively. They're concerned that this represented carcinoma. They were unable to pass a Foley catheter. They recommended a suprapubic catheter be placed by interventional radiology postop. Also recommended a CT scan of the abdomen and pelvis and they will continue follow-up and workup this additional new finding.  Patient was awakened extubated, and transferred to recovery in stable condition.   PATIENT DISPOSITION:  To PACU, stable

## 2017-12-09 NOTE — Progress Notes (Signed)
PHARMACIST - PHYSICIAN ORDER COMMUNICATION  CONCERNING: P&T Medication Policy on Herbal Medications  DESCRIPTION:  This patient's order for: ZLycopene and Saw Palmetto has been noted.  This product(s) is classified as an "herbal" or natural product. Due to a lack of definitive safety studies or FDA approval, nonstandard manufacturing practices, plus the potential risk of unknown drug-drug interactions while on inpatient medications, the Pharmacy and Therapeutics Committee does not permit the use of "herbal" or natural products of this type within [facility].   ACTION TAKEN: The pharmacy department is unable to verify this order at this time and your patient has been informed of this safety policy. Please reevaluate patient's clinical condition at discharge and address if the herbal or natural product(s) should be resumed at that time.

## 2017-12-09 NOTE — Anesthesia Procedure Notes (Signed)
Procedure Name: Intubation Date/Time: 12/09/2017 7:39 AM Performed by: Imagene Riches, CRNA Pre-anesthesia Checklist: Patient identified, Emergency Drugs available, Suction available and Patient being monitored Patient Re-evaluated:Patient Re-evaluated prior to induction Oxygen Delivery Method: Circle System Utilized Preoxygenation: Pre-oxygenation with 100% oxygen Induction Type: IV induction Ventilation: Mask ventilation without difficulty Laryngoscope Size: Miller and 3 Grade View: Grade I Tube type: Oral Tube size: 7.5 mm Number of attempts: 1 Airway Equipment and Method: Stylet and Oral airway Placement Confirmation: ETT inserted through vocal cords under direct vision,  positive ETCO2 and breath sounds checked- equal and bilateral Secured at: 22 cm Tube secured with: Tape Dental Injury: Teeth and Oropharynx as per pre-operative assessment

## 2017-12-10 ENCOUNTER — Encounter (HOSPITAL_COMMUNITY): Payer: Self-pay | Admitting: Otolaryngology

## 2017-12-10 LAB — GLUCOSE, CAPILLARY
GLUCOSE-CAPILLARY: 187 mg/dL — AB (ref 65–99)
GLUCOSE-CAPILLARY: 195 mg/dL — AB (ref 65–99)
Glucose-Capillary: 192 mg/dL — ABNORMAL HIGH (ref 65–99)
Glucose-Capillary: 180 mg/dL — ABNORMAL HIGH (ref 65–99)

## 2017-12-10 LAB — CALCIUM
CALCIUM: 8.5 mg/dL — AB (ref 8.9–10.3)
Calcium: 8.3 mg/dL — ABNORMAL LOW (ref 8.9–10.3)
Calcium: 8.7 mg/dL — ABNORMAL LOW (ref 8.9–10.3)

## 2017-12-10 NOTE — Progress Notes (Signed)
Spoke to Radiology, bx to completed on Monday 12/12/2017 if Pt is still here

## 2017-12-10 NOTE — Progress Notes (Signed)
12/10/2017 10:56 AM  Corrinne Eagle [id number]  Post-Op Day 1    Temp:  [97.2 F (36.2 C)-98.8 F (37.1 C)] 98.2 F (36.8 C) (06/01 0939) Pulse Rate:  [66-74] 66 (06/01 0939) Resp:  [6-18] 15 (06/01 0939) BP: (127-151)/(66-97) 127/66 (06/01 0939) SpO2:  [91 %-96 %] 95 % (06/01 0939) Arterial Line BP: (158-161)/(60-63) 161/60 (05/31 1135),     Intake/Output Summary (Last 24 hours) at 12/10/2017 1056 Last data filed at 12/10/2017 0943 Gross per 24 hour  Intake 805 ml  Output 1150 ml  Net -345 ml   JP drain minimal.  Results for orders placed or performed during the hospital encounter of 12/09/17 (from the past 24 hour(s))  Protime-INR     Status: None   Collection Time: 12/09/17 12:27 PM  Result Value Ref Range   Prothrombin Time 13.2 11.4 - 15.2 seconds   INR 1.01   Calcium     Status: Abnormal   Collection Time: 12/09/17 12:27 PM  Result Value Ref Range   Calcium 8.7 (L) 8.9 - 10.3 mg/dL  Glucose, capillary     Status: Abnormal   Collection Time: 12/09/17  5:22 PM  Result Value Ref Range   Glucose-Capillary 244 (H) 65 - 99 mg/dL  Calcium     Status: Abnormal   Collection Time: 12/09/17  8:28 PM  Result Value Ref Range   Calcium 8.4 (L) 8.9 - 10.3 mg/dL  Comprehensive metabolic panel     Status: Abnormal   Collection Time: 12/09/17  8:28 PM  Result Value Ref Range   Sodium 140 135 - 145 mmol/L   Potassium 4.1 3.5 - 5.1 mmol/L   Chloride 102 101 - 111 mmol/L   CO2 26 22 - 32 mmol/L   Glucose, Bld 214 (H) 65 - 99 mg/dL   BUN 24 (H) 6 - 20 mg/dL   Creatinine, Ser 1.18 0.61 - 1.24 mg/dL   Calcium 8.5 (L) 8.9 - 10.3 mg/dL   Total Protein 6.1 (L) 6.5 - 8.1 g/dL   Albumin 3.2 (L) 3.5 - 5.0 g/dL   AST 22 15 - 41 U/L   ALT 22 17 - 63 U/L   Alkaline Phosphatase 36 (L) 38 - 126 U/L   Total Bilirubin 0.8 0.3 - 1.2 mg/dL   GFR calc non Af Amer >60 >60 mL/min   GFR calc Af Amer >60 >60 mL/min   Anion gap 12 5 - 15  Glucose, capillary     Status: Abnormal   Collection  Time: 12/09/17  8:47 PM  Result Value Ref Range   Glucose-Capillary 226 (H) 65 - 99 mg/dL  Glucose, capillary     Status: Abnormal   Collection Time: 12/09/17 10:02 PM  Result Value Ref Range   Glucose-Capillary 218 (H) 65 - 99 mg/dL  Glucose, capillary     Status: Abnormal   Collection Time: 12/10/17  7:40 AM  Result Value Ref Range   Glucose-Capillary 187 (H) 65 - 99 mg/dL   CT abd/pelvis:  2 pulm nodules of uncertain significance.  No adenopathy.  SUBJECTIVE:  Pain better. Voice better.  Eating solids.  Ambulating.  Voiding easily.  OBJECTIVE:  Voice more clear.  No Schvostek's sign.  Neck flat.  Drain functional  IMPRESSION:  Satisfactory check. No Ca++ check since 2040 yest.  PLAN:  Advance diet and activity.  Continue Ca++ monitoring.  Possible drain out and discharge home tomorrow.  No Urology consult yet.   Jodi Marble

## 2017-12-10 NOTE — Progress Notes (Signed)
0800  Per CT pt needs CMP before IV contrast can be administered. Dr Erik Obey gave verbal order for CMP.  Also notified CBG have been over 200 last 8 hours, Dr Erik Obey gave authorization for hospitalist consult for patient to gain a glycemic control set. Order was placed for consult.

## 2017-12-10 NOTE — Plan of Care (Signed)
  Problem: Coping: Goal: Level of anxiety will decrease Outcome: Progressing   Problem: Elimination: Goal: Will not experience complications related to bowel motility Outcome: Progressing Goal: Will not experience complications related to urinary retention Outcome: Progressing   Problem: Pain Managment: Goal: General experience of comfort will improve Outcome: Progressing   

## 2017-12-10 NOTE — Progress Notes (Signed)
IR aware of request for possible US guided inguinal LN biopsy. Biopsy can be offered Monday if still inpt. Otherwise, if pt discharged, will need to be ordered and scheduled to be done as outpt.   Ascencion Dike PA-C Interventional Radiology 12/10/2017 2:53 PM

## 2017-12-11 ENCOUNTER — Encounter (HOSPITAL_COMMUNITY): Payer: Self-pay | Admitting: Radiology

## 2017-12-11 LAB — GLUCOSE, CAPILLARY
Glucose-Capillary: 139 mg/dL — ABNORMAL HIGH (ref 65–99)
Glucose-Capillary: 84 mg/dL (ref 65–99)
Glucose-Capillary: 143 mg/dL — ABNORMAL HIGH (ref 65–99)
Glucose-Capillary: 170 mg/dL — ABNORMAL HIGH (ref 65–99)

## 2017-12-11 LAB — CALCIUM
CALCIUM: 8.2 mg/dL — AB (ref 8.9–10.3)
CALCIUM: 8.5 mg/dL — AB (ref 8.9–10.3)
Calcium: 8 mg/dL — ABNORMAL LOW (ref 8.9–10.3)

## 2017-12-11 MED ORDER — CALCIUM CARBONATE-VITAMIN D 500-200 MG-UNIT PO TABS
2.0000 | ORAL_TABLET | Freq: Two times a day (BID) | ORAL | Status: DC
  Administered 2017-12-11 – 2017-12-12 (×3): 2 via ORAL
  Filled 2017-12-11 (×3): qty 2

## 2017-12-11 NOTE — Discharge Instructions (Signed)
OK to shower Clean wound with Q-tip and water twice daily, then apply a thin coat of antibiotic ointment Hydrocodone for pain.  May alternate with Ibuprofen. No strenuous activity x 10 more days Recheck Dr. Constance Holster 7-10 days, [phone] for an appointment May resume aspirin Continue thyroid replacement medication (synthroid).

## 2017-12-11 NOTE — Progress Notes (Signed)
Chief Complaint: Patient was seen in consultation today for LN biopsy at the request of Dr. Nicolette Bang  Referring Physician(s): Dr. Nicolette Bang  Supervising Physician: Markus Daft  Patient Status: [facility] - In-pt  History of Present Illness: Barry Gardner is a 67 y.o. male thyroidectomy and selective neck dissection a couple days ago for large thyroid mass. While under anesthesia, foley catheter was attempted to be placed but was unsuccessful due to large fungating penile mass. Urology was also unable to pass foley. Initially, they requested IR to place an SP tube but after discussion, the pt was not having difficulty voiding on his own and was not in retention. He was admitted post op and CT C/A/P was obtained. Though not mentioned in the report, there is some inguinal lymphadenopathy possibly related to the penile mass. Dr. Alyson Ingles now requests IR perform needle bx of a LN. Chart, meds, labs, imaging reviewed. Dr. Pascal Lux reviewed CT and though adenopathy does not appear to be of pathologic size, it may still be related to the penile mass. Feels it is amenable to perc bx for tissue sampling purposes.  Past Medical History:  Diagnosis Date  . BPH (benign prostatic hyperplasia)   . Family history of adverse reaction to anesthesia    /daughter "I get aggressive when I wake up" (12/09/2017)  . Hypertension   . Thyroid cancer ([place]) 2019  . Type II diabetes mellitus ([place])     Past Surgical History:  Procedure Laterality Date  . LAPAROSCOPIC CHOLECYSTECTOMY  2000s  . LYMPH NODE BIOPSY Right ~ 10/2017   "@ [place]"  . MODIFIED RADICAL NECK DISSECTION Right 12/09/2017  . RADICAL NECK DISSECTION Right 12/09/2017   Procedure: RADICAL NECK DISSECTION;  Surgeon: Izora Gala, MD;  Location: [place];  Service: ENT;  Laterality: Right;  modified neck dissection  . THYROIDECTOMY Right 12/09/2017  . THYROIDECTOMY Right 12/09/2017   Procedure: THYROIDECTOMY;  Surgeon: Izora Gala, MD;   Location: [place];  Service: ENT;  Laterality: Right;    Allergies: Patient has no known allergies.  Medications:  Current Facility-Administered Medications:  .  amLODipine (NORVASC) tablet 5 mg, 5 mg, Oral, Daily, 5 mg at 12/11/17 1023 **AND** irbesartan (AVAPRO) tablet 300 mg, 300 mg, Oral, Daily, Izora Gala, MD, 300 mg at 12/11/17 1025 .  aspirin EC tablet 81 mg, 81 mg, Oral, Daily, Izora Gala, MD, 81 mg at 12/11/17 1024 .  atenolol (TENORMIN) tablet 100 mg, 100 mg, Oral, Daily, 100 mg at 12/11/17 1024 **AND** chlorthalidone (HYGROTON) tablet 25 mg, 25 mg, Oral, Daily, Izora Gala, MD, 25 mg at 12/10/17 1040 .  B-complex with vitamin C tablet 1 tablet, 1 tablet, Oral, Daily, Izora Gala, MD, 1 tablet at 12/11/17 1025 .  bacitracin ointment 1 application, 1 application, Topical, Z7Q, Izora Gala, MD, 1 application at 73/41/93 0550 .  calcium-vitamin D (OSCAL WITH D) 500-200 MG-UNIT per tablet 2 tablet, 2 tablet, Oral, BID, Jodi Marble, MD, 2 tablet at 12/11/17 1246 .  canagliflozin [facility]) tablet 100 mg, 100 mg, Oral, QAC breakfast, 100 mg at 12/11/17 0845 **AND** metFORMIN (GLUCOPHAGE-XR) 24 hr tablet 2,000 mg, 2,000 mg, Oral, Q breakfast, Izora Gala, MD .  cholecalciferol (VITAMIN D) tablet 5,000 Units, 5,000 Units, Oral, Daily, Izora Gala, MD, 5,000 Units at 12/11/17 1025 .  glipiZIDE (GLUCOTROL XL) 24 hr tablet 10 mg, 10 mg, Oral, Q breakfast, Izora Gala, MD, 10 mg at 12/10/17 2246 .  HYDROcodone-acetaminophen (NORCO/VICODIN) 5-325 MG per tablet 1-2 tablet, 1-2 tablet, Oral,  Q4H PRN, Izora Gala, MD, 1 tablet at 12/09/17 1301 .  insulin aspart (novoLOG) injection 0-15 Units, 0-15 Units, Subcutaneous, TID WC, Izora Gala, MD, 2 Units at 12/11/17 [phone] .  levothyroxine (SYNTHROID, LEVOTHROID) tablet 100 mcg, 100 mcg, Oral, QAC breakfast, Izora Gala, MD, 100 mcg at 12/11/17 0803 .  multivitamin with minerals tablet 1 tablet, 1 tablet, Oral, Daily, Izora Gala, MD, 1  tablet at 12/11/17 1025 .  omega-3 acid ethyl esters (LOVAZA) capsule 1 g, 1 g, Oral, Daily, Izora Gala, MD, 1 g at 12/11/17 1024 .  pravastatin (PRAVACHOL) tablet 40 mg, 40 mg, Oral, QHS, Izora Gala, MD, 40 mg at 12/10/17 2157 .  promethazine (PHENERGAN) tablet 25 mg, 25 mg, Oral, Q6H PRN, 25 mg at 12/09/17 1849 **OR** promethazine (PHENERGAN) suppository 25 mg, 25 mg, Rectal, Q6H PRN, Izora Gala, MD .  tamsulosin [facility]) capsule 0.4 mg, 0.4 mg, Oral, QHS, Izora Gala, MD, 0.4 mg at 12/10/17 2157 .  vitamin C (ASCORBIC ACID) tablet 2,000 mg, 2,000 mg, Oral, Daily, Izora Gala, MD, 2,000 mg at 12/11/17 1024    Family History  Problem Relation Age of Onset  . Diabetes Mother   . Dementia Mother   . Asthma Mother   . Stroke Father   . Aneurysm Father   . Diabetes Brother     Social History   Socioeconomic History  . Marital status: Married    Spouse name: Not on file  . Number of children: Not on file  . Years of education: Not on file  . Highest education level: Not on file  Occupational History  . Not on file  Social Needs  . Financial resource strain: Not on file  . Food insecurity:    Worry: Not on file    Inability: Not on file  . Transportation needs:    Medical: Not on file    Non-medical: Not on file  Tobacco Use  . Smoking status: Never Smoker  . Smokeless tobacco: Never Used  Substance and Sexual Activity  . Alcohol use: Never    Alcohol/week: 0.0 oz    Frequency: Never  . Drug use: Never  . Sexual activity: Not on file  Lifestyle  . Physical activity:    Days per week: Not on file    Minutes per session: Not on file  . Stress: Not on file  Relationships  . Social connections:    Talks on phone: Not on file    Gets together: Not on file    Attends religious service: Not on file    Active member of club or organization: Not on file    Attends meetings of clubs or organizations: Not on file    Relationship status: Not on file  Other Topics  Concern  . Not on file  Social History Narrative  . Not on file     Review of Systems: A 12 point ROS discussed and pertinent positives are indicated in the HPI above.  All other systems are negative.  Review of Systems  Vital Signs: BP 131/70   Pulse (!) 59   Temp (!) 97.5 F (36.4 C) (Oral)   Resp 17   Ht 5\' 10"  (1.778 m)   Wt 219 lb (99.3 kg)   SpO2 97%   BMI 31.42 kg/m   Physical Exam  Constitutional: He is oriented to person, place, and time. He appears well-developed. No distress.  Neck: Normal range of motion. No JVD present.  Post neck dissection incision c/d/i  Cardiovascular: Normal rate, regular rhythm and normal heart sounds.  Pulmonary/Chest: Effort normal and breath sounds normal. No respiratory distress.  Abdominal: Soft. Bowel sounds are normal. He exhibits no mass. There is no tenderness.  Neurological: He is alert and oriented to person, place, and time.  Skin: Skin is warm and dry.  Psychiatric: He has a normal mood and affect.    Imaging: Dg Chest 2 View  Result Date: 11/26/2017 CLINICAL DATA:  Preoperative evaluation. EXAM: CHEST - 2 VIEW COMPARISON:  None. FINDINGS: Cardiac contours upper limits of normal. No consolidative pulmonary opacities. No pleural effusion or pneumothorax. Thoracic spine degenerative changes. IMPRESSION: No acute cardiopulmonary process. Electronically Signed   By: Lovey Newcomer M.D.   On: 11/26/2017 20:31   Ct Abdomen Pelvis W Contrast  Result Date: 12/10/2017 CLINICAL DATA:  67 year old male for staging of newly diagnosed penile cancer. EXAM: CT ABDOMEN AND PELVIS WITH CONTRAST TECHNIQUE: Multidetector CT imaging of the abdomen and pelvis was performed using the standard protocol following bolus administration of intravenous contrast. CONTRAST:  100 cc intravenous Omnipaque 300 COMPARISON:  None. FINDINGS: Lower chest: A 6 mm RIGHT LOWER lobe nodule (series 4: Image 10) and a 6 mm LEFT LOWER lobe nodule (4:11) are nonspecific.  Mild bibasilar atelectasis/scarring noted. Hepatobiliary: The liver is unremarkable. Patient is status post cholecystectomy. No biliary dilatation. Pancreas: Unremarkable Spleen: Unremarkable Adrenals/Urinary Tract: The kidneys, adrenal glands and bladder are unremarkable except for probable bilateral renal cysts. Stomach/Bowel: Stomach is within normal limits. Appendix appears normal. No evidence of bowel wall thickening, distention, or inflammatory changes. Colonic diverticulosis noted without evidence of diverticulitis. Vascular/Lymphatic: Aortic atherosclerosis. No enlarged abdominal or pelvic lymph nodes. Reproductive: Soft tissue irregularity along the tip of the penis likely represents the patient's penile mass. No other abnormalities identified. Other: No ascites, pneumoperitoneum or focal collection. Musculoskeletal: No acute or suspicious bony abnormalities. IMPRESSION: 1. Indeterminate 6 mm pulmonary nodules within the LOWER lobes bilaterally. Consider complete chest CT with contrast for further evaluation of the entire chest or short-term CT follow-up in 3-6 months. 2. Soft tissue irregularity of the distal penis likely representing this patient's penile mass. No evidence of metastatic disease or lymphadenopathy within the abdomen or pelvis. Electronically Signed   By: Margarette Canada M.D.   On: 12/10/2017 00:34    Labs:  CBC: Recent Labs    10/19/17 1803 11/25/17 1610  WBC 7.0 7.8  HGB 14.4 14.4  HCT 41.8 42.3  PLT 179 187    COAGS: Recent Labs    12/09/17 1227  INR 1.01    BMP: Recent Labs    06/22/17 1753 10/19/17 1803 11/25/17 1610  12/09/17 2028  12/10/17 1217 12/10/17 1851 12/11/17 0302 12/11/17 1018  NA 138 143 141  --  140  --   --   --   --   --   K 3.6 4.3 3.5  --  4.1  --   --   --   --   --   CL 99 98 99*  --  102  --   --   --   --   --   CO2 27 27 33*  --  26  --   --   --   --   --   GLUCOSE 192* 152* 127*  --  214*  --   --   --   --   --   BUN 25 23 18    --  24*  --   --   --   --   --  CALCIUM 9.6 9.6 9.7   < > 8.5*  8.4*   < > 8.5* 8.7* 8.2* 8.0*  CREATININE 1.07 1.17 1.04  --  1.18  --   --   --   --   --   GFRNONAA  --  64 >60  --  >60  --   --   --   --   --   GFRAA  --  74 >60  --  >60  --   --   --   --   --    < > = values in this interval not displayed.    LIVER FUNCTION TESTS: Recent Labs    06/22/17 1753 10/19/17 1803 12/09/17 2028  BILITOT 0.3 0.2 0.8  AST 16 17 22   ALT 15 16 22   ALKPHOS  --  50 36*  PROT 6.5 7.2 6.1*  ALBUMIN  --  4.7 3.2*    TUMOR MARKERS: No results for input(s): AFPTM, CEA, CA199, CHROMGRNA in the last 8760 hours.  Assessment and Plan: Penile mass with inguinal adenopathy For US guided core bx tomorrow. NPO p MN for possible sedation. Risks and benefits discussed with the patient including, but not limited to bleeding, infection, damage to adjacent structures or low yield requiring additional tests.  All of the patient's questions were answered, patient is agreeable to proceed. Consent signed and in chart.    Thank you for this interesting consult.  I greatly enjoyed meeting Barry Gardner and look forward to participating in their care.  A copy of this report was sent to the requesting provider on this date.  Electronically Signed: Ascencion Dike, PA-C 12/11/2017, 1:12 PM   I spent a total of 20 minutes in face to face in clinical consultation, greater than 50% of which was counseling/coordinating care for LN bx

## 2017-12-11 NOTE — Progress Notes (Signed)
12/11/2017 11:30 AM  Barry Gardner [id number]  Post-Op Day 2    Temp:  [97.5 F (36.4 C)-98.7 F (37.1 C)] 97.5 F (36.4 C) (06/02 0531) Pulse Rate:  [53-64] 59 (06/02 1023) Resp:  [16-17] 17 (06/02 0531) BP: (114-138)/(65-77) 131/70 (06/02 1023) SpO2:  [93 %-97 %] 97 % (06/02 0531),     Intake/Output Summary (Last 24 hours) at 12/11/2017 1130 Last data filed at 12/11/2017 0911 Gross per 24 hour  Intake 1128.5 ml  Output 10 ml  Net 1118.5 ml   JP 30 ml  Results for orders placed or performed during the hospital encounter of 12/09/17 (from the past 24 hour(s))  Glucose, capillary     Status: Abnormal   Collection Time: 12/10/17 11:54 AM  Result Value Ref Range   Glucose-Capillary 192 (H) 65 - 99 mg/dL  Calcium     Status: Abnormal   Collection Time: 12/10/17 12:17 PM  Result Value Ref Range   Calcium 8.5 (L) 8.9 - 10.3 mg/dL  Glucose, capillary     Status: Abnormal   Collection Time: 12/10/17  4:40 PM  Result Value Ref Range   Glucose-Capillary 195 (H) 65 - 99 mg/dL  Calcium     Status: Abnormal   Collection Time: 12/10/17  6:51 PM  Result Value Ref Range   Calcium 8.7 (L) 8.9 - 10.3 mg/dL  Glucose, capillary     Status: Abnormal   Collection Time: 12/10/17  9:34 PM  Result Value Ref Range   Glucose-Capillary 180 (H) 65 - 99 mg/dL  Calcium     Status: Abnormal   Collection Time: 12/11/17  3:02 AM  Result Value Ref Range   Calcium 8.2 (L) 8.9 - 10.3 mg/dL  Glucose, capillary     Status: Abnormal   Collection Time: 12/11/17  7:49 AM  Result Value Ref Range   Glucose-Capillary 139 (H) 65 - 99 mg/dL  Calcium     Status: Abnormal   Collection Time: 12/11/17 10:18 AM  Result Value Ref Range   Calcium 8.0 (L) 8.9 - 10.3 mg/dL    SUBJECTIVE:  Min pain.  Ambulating.  Voiding.  Taking good po's. Notes numbness RIGHT ear.  OBJECTIVE:  Neck flat.   IMPRESSION:  Satisfactory check  PLAN:  For IR needle biopsy inguinal node(s) tomorrow. Plan D/C after that.  Orders  written.    Jodi Marble

## 2017-12-12 ENCOUNTER — Inpatient Hospital Stay (HOSPITAL_COMMUNITY): Payer: BLUE CROSS/BLUE SHIELD

## 2017-12-12 LAB — GLUCOSE, CAPILLARY
Glucose-Capillary: 170 mg/dL — ABNORMAL HIGH (ref 65–99)
Glucose-Capillary: 130 mg/dL — ABNORMAL HIGH (ref 65–99)

## 2017-12-12 LAB — CALCIUM: CALCIUM: 8.6 mg/dL — AB (ref 8.9–10.3)

## 2017-12-12 MED ORDER — MIDAZOLAM HCL 2 MG/2ML IJ SOLN
INTRAMUSCULAR | Status: AC
  Filled 2017-12-12: qty 4

## 2017-12-12 MED ORDER — LEVOTHYROXINE SODIUM 100 MCG PO TABS: 100.0000 ug | ORAL_TABLET | Freq: Every day | ORAL | 6 refills | Status: DC

## 2017-12-12 MED ORDER — LIDOCAINE HCL (PF) 1 % IJ SOLN
INTRAMUSCULAR | Status: AC
  Filled 2017-12-12: qty 30

## 2017-12-12 MED ORDER — MIDAZOLAM HCL 2 MG/2ML IJ SOLN
INTRAMUSCULAR | Status: AC | PRN
  Administered 2017-12-12: 1 mg via INTRAVENOUS

## 2017-12-12 MED ORDER — FENTANYL CITRATE (PF) 100 MCG/2ML IJ SOLN
INTRAMUSCULAR | Status: AC | PRN
  Administered 2017-12-12: 50 ug via INTRAVENOUS
  Administered 2017-12-12: 25 ug via INTRAVENOUS

## 2017-12-12 MED ORDER — FENTANYL CITRATE (PF) 100 MCG/2ML IJ SOLN
INTRAMUSCULAR | Status: AC
  Filled 2017-12-12: qty 4

## 2017-12-12 NOTE — Procedures (Signed)
Primarily normal appearing inguinal lymph nodes.  Most hypoechoic node on right side was biopsied.  5 core biopsies obtained and placed in saline.  Minimal blood loss and no immediate complication.

## 2017-12-12 NOTE — Sedation Documentation (Signed)
Patient is resting comfortably. Denies Pain.

## 2017-12-12 NOTE — Discharge Summary (Signed)
Physician Discharge Summary  Patient ID: Barry Gardner MRN: [id number] DOB/AGE: 1950-08-31 67 y.o.  Admit date: 12/09/2017 Discharge date: 12/12/2017  Admission Diagnoses: Thyroid cancer  Discharge Diagnoses:  Active Problems:   Thyroid cancer [facility])   Discharged Condition: good  Hospital Course: No complications related to the thyroid and neck surgery.  Incidental finding of the penis mass being worked up with core biopsy of inguinal node.  Consults: none  Significant Diagnostic Studies: none  Treatments: surgery: Total thyroidectomy, modified neck dissection  Discharge Exam: Blood pressure (!) 157/78, pulse 66, temperature 97.7 F (36.5 C), temperature source Oral, resp. rate 16, height 5\' 10"  (1.778 m), weight 99.3 kg (219 lb), SpO2 96 %. PHYSICAL EXAM: Awake and alert, neck looks excellent.  Staples in place.  No swelling.  Voice is strong.  Disposition:     Follow-up Information    Izora Gala, MD Follow up in 1 week(s).   Specialty:  Otolaryngology Contact information: [address] [place] [address] [phone]           Signed: Izora Gala 12/12/2017, 9:18 AM

## 2017-12-12 NOTE — Progress Notes (Signed)
Discharge home. Home discharge instruction given, no question verbalized. 

## 2017-12-12 NOTE — Sedation Documentation (Signed)
Patient is resting comfortably. 

## 2018-02-24 ENCOUNTER — Other Ambulatory Visit (HOSPITAL_COMMUNITY): Payer: Self-pay | Admitting: Endocrinology

## 2018-02-24 DIAGNOSIS — C73 Malignant neoplasm of thyroid gland: Secondary | ICD-10-CM

## 2018-03-15 ENCOUNTER — Encounter (HOSPITAL_COMMUNITY)
Admission: RE | Admit: 2018-03-15 | Discharge: 2018-03-15 | Disposition: A | Discharge status: Home / Self Care | Payer: BLUE CROSS/BLUE SHIELD | Source: Ambulatory Visit | Attending: Endocrinology | Admitting: Endocrinology

## 2018-03-15 DIAGNOSIS — C73 Malignant neoplasm of thyroid gland: Secondary | ICD-10-CM | POA: Diagnosis not present

## 2018-03-15 MED ORDER — STERILE WATER FOR INJECTION IJ SOLN
INTRAMUSCULAR | Status: AC
  Filled 2018-03-15: qty 10

## 2018-03-15 MED ORDER — THYROTROPIN ALFA 1.1 MG IM SOLR
0.9000 mg | INTRAMUSCULAR | Status: AC
  Administered 2018-03-15: 0.9 mg via INTRAMUSCULAR

## 2018-03-16 ENCOUNTER — Encounter (HOSPITAL_COMMUNITY)
Admission: RE | Admit: 2018-03-16 | Discharge: 2018-03-16 | Disposition: A | Discharge status: Home / Self Care | Payer: BLUE CROSS/BLUE SHIELD | Source: Ambulatory Visit | Attending: Endocrinology | Admitting: Endocrinology

## 2018-03-16 DIAGNOSIS — C73 Malignant neoplasm of thyroid gland: Secondary | ICD-10-CM | POA: Diagnosis not present

## 2018-03-16 MED ORDER — STERILE WATER FOR INJECTION IJ SOLN
INTRAMUSCULAR | Status: AC
  Filled 2018-03-16: qty 10

## 2018-03-16 MED ORDER — THYROTROPIN ALFA 1.1 MG IM SOLR
0.9000 mg | INTRAMUSCULAR | Status: AC
  Administered 2018-03-16: 0.9 mg via INTRAMUSCULAR

## 2018-03-17 ENCOUNTER — Encounter (HOSPITAL_COMMUNITY)
Admission: RE | Admit: 2018-03-17 | Discharge: 2018-03-17 | Disposition: A | Discharge status: Home / Self Care | Payer: BLUE CROSS/BLUE SHIELD | Source: Ambulatory Visit | Attending: Endocrinology | Admitting: Endocrinology

## 2018-03-17 DIAGNOSIS — C73 Malignant neoplasm of thyroid gland: Secondary | ICD-10-CM | POA: Diagnosis not present

## 2018-03-17 MED ORDER — SODIUM IODIDE I 131 CAPSULE
149.0000 | Freq: Once | INTRAVENOUS | Status: AC | PRN
  Administered 2018-03-17: 149 via ORAL

## 2018-03-24 ENCOUNTER — Ambulatory Visit (HOSPITAL_COMMUNITY)
Admission: RE | Admit: 2018-03-24 | Discharge: 2018-03-24 | Disposition: A | Discharge status: Home / Self Care | Payer: BLUE CROSS/BLUE SHIELD | Source: Ambulatory Visit | Attending: Endocrinology | Admitting: Endocrinology

## 2018-03-24 DIAGNOSIS — C73 Malignant neoplasm of thyroid gland: Secondary | ICD-10-CM | POA: Insufficient documentation

## 2018-03-26 ENCOUNTER — Other Ambulatory Visit: Payer: Self-pay | Admitting: Family Medicine

## 2018-03-27 NOTE — Telephone Encounter (Signed)
Pravastatin 40 mg refill Last Refill:10/23/17 # 90 and one refill Last OV: 10/21/17 PCP: [facility] # [phone]

## 2018-05-23 ENCOUNTER — Other Ambulatory Visit: Payer: Self-pay | Admitting: Family Medicine

## 2018-05-23 NOTE — Telephone Encounter (Signed)
Last office visit 10/20/17; no upcoming visits noted; attempted to contact pt; his daughter [name] (emergency contact) answered the phone [phone] inquired who was calling and why; explained that this call was from [facility] and that this call was for the pt; further explained that if she verified the pt information the matter could be discussed with her; she refused and states "I feel like this is weird for him to be contacted like this"; unable to schedule office visit at this time; will route to office for notification.  Requested Prescriptions  Pending Prescriptions Disp Refills  . glipiZIDE (GLUCOTROL XL) 10 MG 24 hr tablet [Pharmacy Med Name: GLIPIZIDE ER 10 MG TABLET] 30 tablet 0    Sig: TAKE 1 TABLET BY MOUTH DAILY WITH BREAKFAST.     Endocrinology:  Diabetes - Sulfonylureas Failed - 05/23/2018  2:35 AM      Failed - HBA1C is between 0 and 7.9 and within 180 days    Hgb A1c MFr Bld  Date Value Ref Range Status  10/19/2017 6.6 (H) 4.8 - 5.6 % Final    Comment:             Prediabetes: 5.7 - 6.4          Diabetes: >6.4          Glycemic control for adults with diabetes: <7.0          Failed - Valid encounter within last 6 months    Recent Outpatient Visits          7 months ago Uncontrolled type 2 diabetes mellitus with hyperglycemia [facility])   [facility], Laurey Arrow, MD   11 months ago Uncontrolled type 2 diabetes mellitus with hyperglycemia [facility])   [facility], Laurey Arrow, MD   1 year ago Uncontrolled type 2 diabetes mellitus with hyperglycemia, without long-term current use of insulin [facility])   [facility], Laurey Arrow, MD   2 years ago Uncontrolled type 2 diabetes mellitus with hyperglycemia, without long-term current use of insulin 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

## 2018-06-03 ENCOUNTER — Other Ambulatory Visit: Payer: Self-pay | Admitting: Family Medicine

## 2018-06-05 NOTE — Telephone Encounter (Signed)
Requested medication (s) are due for refill today: yes  Requested medication (s) are on the active medication list: yes  Last refill:  10/23/17 for 90 tabs and one refill  Future visit scheduled: no  Notes to clinic:  [place]  10/23/17. ARB combos failed  Requested Prescriptions  Pending Prescriptions Disp Refills   amLODipine-olmesartan (AZOR) 5-40 MG tablet [Pharmacy Med Name: AMLODIPINE-OLMESARTAN 5-40 MG] 90 tablet 1    Sig: TAKE 1 TABLET BY MOUTH EVERY DAY     Cardiovascular: CCB + ARB Combos Failed - 06/03/2018  8:41 AM      Failed - Last BP in normal range    BP Readings from Last 1 Encounters:  12/12/17 (!) 142/82         Failed - Valid encounter within last 6 months    Recent Outpatient Visits          7 months ago Uncontrolled type 2 diabetes mellitus with hyperglycemia [facility])   [facility], Laurey Arrow, MD   11 months ago Uncontrolled type 2 diabetes mellitus with hyperglycemia [facility])   [facility], Laurey Arrow, MD   1 year ago Uncontrolled type 2 diabetes mellitus with hyperglycemia, without long-term current use of insulin [facility])   [facility], Laurey Arrow, MD   2 years ago Uncontrolled type 2 diabetes mellitus with hyperglycemia, without long-term current use of insulin [facility])   [facility], Laurey Arrow, MD   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 .    Creatinine, Ser  Date Value Ref Range Status  12/09/2017 1.18 0.61 - 1.24  mg/dL Final         Passed - Patient is not pregnant

## 2018-06-11 ENCOUNTER — Other Ambulatory Visit: Payer: Self-pay | Admitting: Family Medicine

## 2018-06-12 NOTE — Telephone Encounter (Addendum)
Attempted to call patient to schedule an appointment for his refills. No answer, left message to give the office a call to schedule an appointment.   LOV  10/21/17  LR for pravastatin 40 mg  10/23/17 for 90 tabs and 1 refill.  Last labs 2016.  Dr. Brigitte Pulse

## 2018-06-23 ENCOUNTER — Other Ambulatory Visit: Payer: Self-pay | Admitting: Family Medicine

## 2018-06-23 NOTE — Telephone Encounter (Signed)
Call placed to patient.  Left VM to call office. Pt needs appointment for medication refill. (Glipizide)

## 2018-06-23 NOTE — Telephone Encounter (Signed)
Requested medication (s) are due for refill today: Yes  Requested medication (s) are on the active medication list: Yes  Last refill:  06/01/18  Future visit scheduled: No  Notes to clinic:  Unable to refill per protocol     Requested Prescriptions  Pending Prescriptions Disp Refills   glipiZIDE (GLUCOTROL XL) 10 MG 24 hr tablet [Pharmacy Med Name: GLIPIZIDE ER 10 MG TABLET] 30 tablet 0    Sig: TAKE 1 TABLET BY [place]     Endocrinology:  Diabetes - Sulfonylureas Failed - 06/23/2018  2:54 PM      Failed - HBA1C is between 0 and 7.9 and within 180 days    Hgb A1c MFr Bld  Date Value Ref Range Status  10/19/2017 6.6 (H) 4.8 - 5.6 % Final    Comment:             Prediabetes: 5.7 - 6.4          Diabetes: >6.4          Glycemic control for adults with diabetes: <7.0          Failed - Valid encounter within last 6 months    Recent Outpatient Visits          8 months ago Uncontrolled type 2 diabetes mellitus with hyperglycemia [facility])   [facility], Laurey Arrow, MD   1 year ago Uncontrolled type 2 diabetes mellitus with hyperglycemia [facility])   [facility], Laurey Arrow, MD   1 year ago Uncontrolled type 2 diabetes mellitus with hyperglycemia, without long-term current use of insulin [facility])   [facility], Laurey Arrow, MD   2 years ago Uncontrolled type 2 diabetes mellitus with hyperglycemia, without long-term current use of insulin [facility])   [facility], Laurey Arrow, MD   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

## 2018-06-27 ENCOUNTER — Other Ambulatory Visit: Payer: Self-pay | Admitting: *Deleted

## 2018-06-27 ENCOUNTER — Telehealth: Payer: Self-pay | Admitting: Family Medicine

## 2018-06-27 DIAGNOSIS — R310 Gross hematuria: Secondary | ICD-10-CM

## 2018-06-27 MED ORDER — GLIPIZIDE ER 10 MG PO TB24: 10.0000 mg | ORAL_TABLET | Freq: Every day | ORAL | 0 refills | Status: DC

## 2018-06-27 MED ORDER — AMLODIPINE-OLMESARTAN 5-40 MG PO TABS: 1.0000 | ORAL_TABLET | Freq: Every day | ORAL | 0 refills | Status: AC

## 2018-06-27 MED ORDER — LEVOTHYROXINE SODIUM 100 MCG PO TABS: 100.0000 ug | ORAL_TABLET | Freq: Every day | ORAL | 0 refills | Status: AC

## 2018-06-27 MED ORDER — PRAVASTATIN SODIUM 40 MG PO TABS: 40.0000 mg | ORAL_TABLET | Freq: Every day | ORAL | 0 refills | Status: DC

## 2018-06-27 NOTE — Telephone Encounter (Signed)
Copied from [place] [phone]. Topic: Quick Communication - See Telephone Encounter >> Jun 27, 2018 10:16 AM Percell Belt A wrote: CRM for notification. See Telephone encounter for: 06/27/18.  Pt daughter called in and stated that she would like to talk to Dr Brigitte Pulse or Dr Brigitte Pulse CMA only.  She stated that pt went to restroom and had a little bit of blood in his urine and thinks he has a uti?   She would like to know if Dr could put in an order for a urine culture ?    Best number  [phone]

## 2018-06-27 NOTE — Telephone Encounter (Signed)
Spoke to patient daughter advised patient needs an appointment to be seen for medication refills.  Advise once appointment is schedule we can send in enough medication to get patient to appointment.    Must keep appointment that scheduled.    Spoke with Dr. Brigitte Pulse to confirm plan is ok.

## 2018-06-27 NOTE — Telephone Encounter (Signed)
Daughter, [name] calling upset because she was told by front office staff when she came in today to find out when the patient can come in for the urinalysis that he can come quarter to 6pm and bang on the door and someone will let him in, but when the patient arrived and did  So none one answered th door. When I called and spoke to flow coordinator, she says that is a safety issue and the patient could not come in at that time nor is it okay for him to bang on the door. Please call [name] as soon as possible to advise on this urgent situation.

## 2018-06-27 NOTE — Telephone Encounter (Signed)
UA and clx ordered for lab only visit but HIGHLY recommend pt be seen for this - need to look for other more dangerous causes - especially due to recent h/o penile mass.  Rec work into acute provider appointment for this - should NOT wait until I have an opening.

## 2018-06-28 ENCOUNTER — Other Ambulatory Visit: Payer: Self-pay

## 2018-06-28 DIAGNOSIS — R310 Gross hematuria: Secondary | ICD-10-CM

## 2018-06-28 LAB — TIQ-MISC

## 2018-06-28 NOTE — Addendum Note (Signed)
Addended by: Gari Crown D on: 06/28/2018 11:38 AM   Modules accepted: Orders

## 2018-06-30 LAB — URINE CULTURE

## 2018-07-01 ENCOUNTER — Other Ambulatory Visit: Payer: Self-pay | Admitting: Family Medicine

## 2018-07-20 ENCOUNTER — Other Ambulatory Visit: Payer: Self-pay | Admitting: Family Medicine

## 2018-07-20 NOTE — Telephone Encounter (Signed)
Requested medication (s) are due for refill today -yes  Requested medication (s) are on the active medication list -yes  Future visit scheduled -no  Last refill: 06/27/18  Notes to clinic: Patient canceled appointment- is changing PCP- do you want to extend RX until he finds care  Requested Prescriptions  Pending Prescriptions Disp Refills   glipiZIDE (GLUCOTROL XL) 10 MG 24 hr tablet [Pharmacy Med Name: GLIPIZIDE ER 10 MG TABLET] 25 tablet 0    Sig: Take 1 tablet (10 mg total) by mouth daily with breakfast.     Endocrinology:  Diabetes - Sulfonylureas Failed - 07/20/2018  2:55 AM      Failed - HBA1C is between 0 and 7.9 and within 180 days    Hgb A1c MFr Bld  Date Value Ref Range Status  10/19/2017 6.6 (H) 4.8 - 5.6 % Final    Comment:             Prediabetes: 5.7 - 6.4          Diabetes: >6.4          Glycemic control for adults with diabetes: <7.0          Failed - Valid encounter within last 6 months    Recent Outpatient Visits          9 months ago Uncontrolled type 2 diabetes mellitus with hyperglycemia ([place])   [facility], Laurey Arrow, MD   1 year ago Uncontrolled type 2 diabetes mellitus with hyperglycemia [facility])   [facility], Laurey Arrow, MD   1 year ago Uncontrolled type 2 diabetes mellitus with hyperglycemia, without long-term current use of insulin [facility])   [facility], Laurey Arrow, MD   2 years ago Uncontrolled type 2 diabetes mellitus with hyperglycemia, without long-term current use of insulin [facility])   [facility], Laurey Arrow, MD   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     Endocrinology:  Diabetes -  Sulfonylureas Failed - 07/20/2018  2:55 AM      Failed - HBA1C is between 0 and 7.9 and within 180 days    Hgb A1c MFr Bld  Date Value Ref Range Status  10/19/2017 6.6 (H) 4.8 - 5.6 % Final    Comment:             Prediabetes: 5.7 - 6.4          Diabetes: >6.4          Glycemic control for adults with diabetes: <7.0          Failed - Valid encounter within last 6 months    Recent Outpatient Visits          9 months ago Uncontrolled type 2 diabetes mellitus with hyperglycemia [facility])   [facility], Laurey Arrow, MD   1 year ago Uncontrolled type 2 diabetes mellitus with hyperglycemia [facility])   [facility], Laurey Arrow, MD   1 year ago Uncontrolled type 2 diabetes mellitus with hyperglycemia, without long-term current use  of insulin [facility])   [facility], Laurey Arrow, MD   2 years ago Uncontrolled type 2 diabetes mellitus with hyperglycemia, without long-term current use of insulin [facility])   [facility], Laurey Arrow, MD   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

## 2018-07-21 ENCOUNTER — Ambulatory Visit: Payer: BLUE CROSS/BLUE SHIELD | Admitting: Family Medicine

## 2018-07-22 ENCOUNTER — Other Ambulatory Visit: Payer: Self-pay | Admitting: Family Medicine

## 2018-07-24 ENCOUNTER — Other Ambulatory Visit: Payer: Self-pay | Admitting: Family Medicine

## 2018-07-24 NOTE — Telephone Encounter (Signed)
Requested medication (s) are due for refill today: yes  Requested medication (s) are on the active medication list: yes  Last refill:  10/23/17 for 90 tabs and 1 refill  Future visit scheduled: no  Notes to clinic:  Cancelled last appointment sched for 07/21/2018.  Labs overdue.  Cardiovascular: Beta Blocker + Diuretic Combos Failed.  Requested Prescriptions  Pending Prescriptions Disp Refills   atenolol-chlorthalidone (TENORETIC) 100-25 MG tablet [Pharmacy Med Name: ATENOLOL-CHLORTHAL 100-25 TB] 90 tablet 1    Sig: TAKE 1 TABLET BY MOUTH EVERY DAY     Cardiovascular: Beta Blocker + Diuretic Combos Failed - 07/24/2018  1:32 PM      Failed - K in normal range and within 180 days    Potassium  Date Value Ref Range Status  12/09/2017 4.1 3.5 - 5.1 mmol/L Final         Failed - Na in normal range and within 180 days    Sodium  Date Value Ref Range Status  12/09/2017 140 135 - 145 mmol/L Final  10/19/2017 143 134 - 144 mmol/L Final         Failed - Cr in normal range and within 180 days    Creat  Date Value Ref Range Status  06/22/2017 1.07 0.70 - 1.25 mg/dL Final    Comment:    For patients >86 years of age, the reference limit for Creatinine is approximately 13% higher for people identified as African-American. .    Creatinine, Ser  Date Value Ref Range Status  12/09/2017 1.18 0.61 - 1.24 mg/dL Final         Failed - Ca in normal range and within 180 days    Calcium  Date Value Ref Range Status  12/12/2017 8.6 (L) 8.9 - 10.3 mg/dL Final    Comment:    Performed at [facility] [address], [place], [place] [address]         Failed - Last BP in normal range    BP Readings from Last 1 Encounters:  12/12/17 (!) 142/82         Failed - Valid encounter within last 6 months    Recent Outpatient Visits          9 months ago Uncontrolled type 2 diabetes mellitus with hyperglycemia [facility])   [facility], Laurey Arrow, MD   1 year ago Uncontrolled  type 2 diabetes mellitus with hyperglycemia [facility])   [facility], Laurey Arrow, MD   1 year ago Uncontrolled type 2 diabetes mellitus with hyperglycemia, without long-term current use of insulin 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

## 2018-07-24 NOTE — Telephone Encounter (Signed)
Please review for refill of pravastatin 40 mg.  Last labs 2016. Canceled last office visit for  07/21/2018. Last refill was 06/27/18 for 25 tabs. Dr. Brigitte Pulse

## 2018-08-11 ENCOUNTER — Other Ambulatory Visit: Payer: Self-pay | Admitting: Family Medicine

## 2018-08-11 NOTE — Telephone Encounter (Signed)
Requested medication (s) are due for refill today: Due 2 /12/2018  Requested medication (s) are on the active medication list: yes    Last refill: 07/24/2018   #25  Future visit scheduled no  Notes to clinic:   Cancelled last appointment sched for 07/21/2018.  Labs overdue.  Cardiovascular: Beta Blocker + Diuretic Combos Failed.   Requested Prescriptions  Pending Prescriptions Disp Refills   pravastatin (PRAVACHOL) 40 MG tablet [Pharmacy Med Name: PRAVASTATIN SODIUM 40 MG TAB] 25 tablet 0    Sig: TAKE 1 TABLET BY MOUTH EVERY DAY     Cardiovascular:  Antilipid - Statins Failed - 08/11/2018  2:32 AM      Failed - Total Cholesterol in normal range and within 360 days    Cholesterol  Date Value Ref Range Status  12/30/2014 158 0 - 200 mg/dL Final    Comment:    ATP III Classification:       < 200        mg/dL        Desirable      200 - 239     mg/dL        Borderline High      >= 240        mg/dL        High            Failed - LDL in normal range and within 360 days    LDL Cholesterol  Date Value Ref Range Status  12/30/2014 93 0 - 99 mg/dL Final    Comment:      Total Cholesterol/HDL Ratio:CHD Risk                        Coronary Heart Disease Risk Table                                        Men       Women          1/2 Average Risk              3.4        3.3              Average Risk              5.0        4.4           2X Average Risk              9.6        7.1           3X Average Risk             23.4       11.0 Use the calculated Patient Ratio above and the CHD Risk table  to determine the patient's CHD Risk. ATP III Classification (LDL):       < 100        mg/dL         Optimal      100 - 129     mg/dL         Near or Above Optimal      130 - 159     mg/dL         Borderline High      160 - 189     mg/dL  High       > 190        mg/dL         Very High            Failed - HDL in normal range and within 360 days    HDL  Date Value Ref Range  Status  12/30/2014 36 (L) >=40 mg/dL Final    Comment:    ** Please note change in reference range(s). **         Failed - Triglycerides in normal range and within 360 days    Triglycerides  Date Value Ref Range Status  12/30/2014 143 <150 mg/dL Final         Passed - Patient is not pregnant      Passed - Valid encounter within last 12 months    Recent Outpatient Visits          9 months ago Uncontrolled type 2 diabetes mellitus with hyperglycemia [facility])   [facility], Laurey Arrow, MD   1 year ago Uncontrolled type 2 diabetes mellitus with hyperglycemia [facility])   [facility], Laurey Arrow, MD   1 year ago Uncontrolled type 2 diabetes mellitus with hyperglycemia, without long-term current use of insulin [facility])   [facility], Laurey Arrow, MD   2 years ago Uncontrolled type 2 diabetes mellitus with hyperglycemia, without long-term current use of insulin [facility])   [facility], Laurey Arrow, MD   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

## 2018-08-16 ENCOUNTER — Other Ambulatory Visit: Payer: Self-pay | Admitting: Family Medicine

## 2018-08-16 NOTE — Telephone Encounter (Signed)
Requested medication (s) are due for refill today: Yes  Requested medication (s) are on the active medication list: Yes  Last refill:  07/20/18  Future visit scheduled: No  Notes to clinic:  Message left for pt. To schedule an appointment.    Requested Prescriptions  Pending Prescriptions Disp Refills   glipiZIDE (GLUCOTROL XL) 10 MG 24 hr tablet [Pharmacy Med Name: GLIPIZIDE ER 10 MG TABLET] 30 tablet 0    Sig: TAKE 1 TABLET (10 MG TOTAL) BY MOUTH DAILY WITH BREAKFAST.     Endocrinology:  Diabetes - Sulfonylureas Failed - 08/16/2018  2:02 AM      Failed - HBA1C is between 0 and 7.9 and within 180 days    Hgb A1c MFr Bld  Date Value Ref Range Status  10/19/2017 6.6 (H) 4.8 - 5.6 % Final    Comment:             Prediabetes: 5.7 - 6.4          Diabetes: >6.4          Glycemic control for adults with diabetes: <7.0          Failed - Valid encounter within last 6 months    Recent Outpatient Visits          9 months ago Uncontrolled type 2 diabetes mellitus with hyperglycemia [facility])   [facility], Laurey Arrow, MD   1 year ago Uncontrolled type 2 diabetes mellitus with hyperglycemia [facility])   [facility], Laurey Arrow, MD   1 year ago Uncontrolled type 2 diabetes mellitus with hyperglycemia, without long-term current use of insulin [facility])   [facility], Laurey Arrow, MD   2 years ago Uncontrolled type 2 diabetes mellitus with hyperglycemia, without long-term current use of insulin [facility])   [facility], Laurey Arrow, MD   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

## 2018-08-17 ENCOUNTER — Other Ambulatory Visit: Payer: Self-pay | Admitting: Family Medicine

## 2018-08-17 NOTE — Telephone Encounter (Signed)
Protocol failed. Refusing medication at this time. Last refill noted it was to bridge patient-reported he was looking for a different provider.  Requested Prescriptions  Pending Prescriptions Disp Refills  . atenolol-chlorthalidone (TENORETIC) 100-25 MG tablet [Pharmacy Med Name: ATENOLOL-CHLORTHAL 100-25 TB] 30 tablet 0    Sig: TAKE 1 TABLET BY MOUTH EVERY DAY     Cardiovascular: Beta Blocker + Diuretic Combos Failed - 08/17/2018  2:42 PM      Failed - K in normal range and within 180 days    Potassium  Date Value Ref Range Status  12/09/2017 4.1 3.5 - 5.1 mmol/L Final         Failed - Na in normal range and within 180 days    Sodium  Date Value Ref Range Status  12/09/2017 140 135 - 145 mmol/L Final  10/19/2017 143 134 - 144 mmol/L Final         Failed - Cr in normal range and within 180 days    Creat  Date Value Ref Range Status  06/22/2017 1.07 0.70 - 1.25 mg/dL Final    Comment:    For patients >24 years of age, the reference limit for Creatinine is approximately 13% higher for people identified as African-American. .    Creatinine, Ser  Date Value Ref Range Status  12/09/2017 1.18 0.61 - 1.24 mg/dL Final         Failed - Ca in normal range and within 180 days    Calcium  Date Value Ref Range Status  12/12/2017 8.6 (L) 8.9 - 10.3 mg/dL Final    Comment:    Performed at [facility] [address], [place], [place] [address]         Failed - Last BP in normal range    BP Readings from Last 1 Encounters:  12/12/17 (!) 142/82         Failed - Valid encounter within last 6 months    Recent Outpatient Visits          10 months ago Uncontrolled type 2 diabetes mellitus with hyperglycemia [facility])   [facility], Laurey Arrow, MD   1 year ago Uncontrolled type 2 diabetes mellitus with hyperglycemia [facility])   [facility], Laurey Arrow, MD   1 year ago Uncontrolled type 2 diabetes mellitus with hyperglycemia, without long-term current use of  insulin [facility])   [facility], Laurey Arrow, MD   2 years ago Uncontrolled type 2 diabetes mellitus with hyperglycemia, without long-term current use of insulin [facility])   [facility], Laurey Arrow, MD   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

## 2018-08-29 ENCOUNTER — Other Ambulatory Visit: Payer: Self-pay | Admitting: Family Medicine

## 2018-09-17 ENCOUNTER — Other Ambulatory Visit: Payer: Self-pay | Admitting: Family Medicine

## 2018-09-18 NOTE — Telephone Encounter (Signed)
Patient called on cell number listed, left VM to return call to the office to discuss refill request and an appointment.

## 2018-09-18 NOTE — Telephone Encounter (Signed)
Requested medication (s) are due for refill today: Yes  Requested medication (s) are on the active medication list: Yes  Last refill:  07/20/18  Future visit scheduled: No  Notes to clinic:  Unable to refill, failed items on protocol, appointment needed.     Requested Prescriptions  Pending Prescriptions Disp Refills   glipiZIDE (GLUCOTROL XL) 10 MG 24 hr tablet [Pharmacy Med Name: GLIPIZIDE ER 10 MG TABLET] 30 tablet 0    Sig: TAKE 1 TABLET (10 MG TOTAL) BY MOUTH DAILY WITH BREAKFAST.     Endocrinology:  Diabetes - Sulfonylureas Failed - 09/18/2018  1:47 PM      Failed - HBA1C is between 0 and 7.9 and within 180 days    Hgb A1c MFr Bld  Date Value Ref Range Status  10/19/2017 6.6 (H) 4.8 - 5.6 % Final    Comment:             Prediabetes: 5.7 - 6.4          Diabetes: >6.4          Glycemic control for adults with diabetes: <7.0          Failed - Valid encounter within last 6 months    Recent Outpatient Visits          11 months ago Uncontrolled type 2 diabetes mellitus with hyperglycemia [facility])   [facility], Laurey Arrow, MD   1 year ago Uncontrolled type 2 diabetes mellitus with hyperglycemia [facility])   [facility], Laurey Arrow, MD   1 year ago Uncontrolled type 2 diabetes mellitus with hyperglycemia, without long-term current use of insulin [facility])   [facility], Laurey Arrow, MD   2 years ago Uncontrolled type 2 diabetes mellitus with hyperglycemia, without long-term current use of insulin [facility])   [facility], Laurey Arrow, MD   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

## 2018-10-31 ENCOUNTER — Other Ambulatory Visit: Payer: Self-pay | Admitting: Family Medicine

## 2018-12-19 NOTE — Telephone Encounter (Signed)
Phone number is not in service. The phone number on dpr is the same.

## 2019-08-26 ENCOUNTER — Ambulatory Visit: Payer: BLUE CROSS/BLUE SHIELD

## 2019-08-26 ENCOUNTER — Ambulatory Visit: Payer: BLUE CROSS/BLUE SHIELD | Attending: Internal Medicine

## 2019-08-26 DIAGNOSIS — Z23 Encounter for immunization: Secondary | ICD-10-CM | POA: Insufficient documentation

## 2019-08-26 NOTE — Progress Notes (Signed)
   Covid-19 Vaccination Clinic  Name:  Barry Gardner    MRN: [id number] DOB: [birth year]  08/26/2019  [name] was observed post Covid-19 immunization for 15 minutes without incidence. He was provided with Vaccine Information Sheet and instruction to access the V-Safe system.   [name] was instructed to call 911 with any severe reactions post vaccine: Marland Kitchen Difficulty breathing  . Swelling of your face and throat  . A fast heartbeat  . A bad rash all over your body  . Dizziness and weakness    Immunizations Administered    Name Date Dose VIS Date Route   Pfizer COVID-19 Vaccine 08/26/2019 12:16 PM 0.3 mL 06/22/2019 Intramuscular   Manufacturer: [place]   Lot: [id number]   [place]: [id number]

## 2019-09-18 ENCOUNTER — Ambulatory Visit: Payer: Medicare Other | Attending: Internal Medicine

## 2019-09-18 DIAGNOSIS — Z23 Encounter for immunization: Secondary | ICD-10-CM | POA: Insufficient documentation

## 2019-09-18 NOTE — Progress Notes (Signed)
   Covid-19 Vaccination Clinic  Name:  AMOS DELAPP    MRN: [id number] DOB: 1951/01/04  09/18/2019  Mr. Markham was observed post Covid-19 immunization for 15 minutes without incident. He was provided with Vaccine Information Sheet and instruction to access the V-Safe system.   Mr. Arambul was instructed to call 911 with any severe reactions post vaccine: Marland Kitchen Difficulty breathing  . Swelling of face and throat  . A fast heartbeat  . A bad rash all over body  . Dizziness and weakness   Immunizations Administered    Name Date Dose VIS Date Route   Pfizer COVID-19 Vaccine 09/18/2019 12:02 PM 0.3 mL 06/22/2019 Intramuscular   Manufacturer: [place]   Lot: [id number]   [place]: [id number]

## 2019-10-28 IMAGING — US US THYROID
1 series · 12 of 25 positions shown · non-contrast
Comparison: None.

CLINICAL DATA: Palpable abnormality. Right-sided thyroid nodules
palpated on physical examination.

EXAM:
THYROID ULTRASOUND
TECHNIQUE: Ultrasound examination of the thyroid gland and adjacent soft
tissues was performed.

[Series 1: us thyroid · 0.08mm/px · 62 acquisitions, 12 frames shown]
[im 3/62]
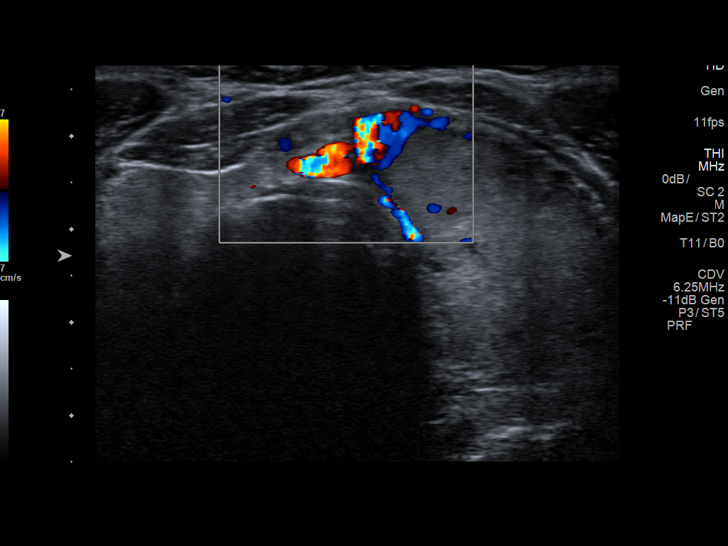
[im 8/62]
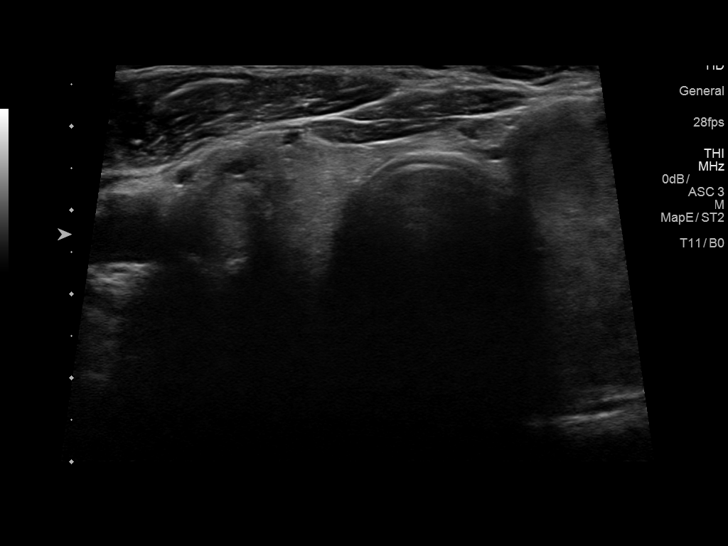
[im 13/62]
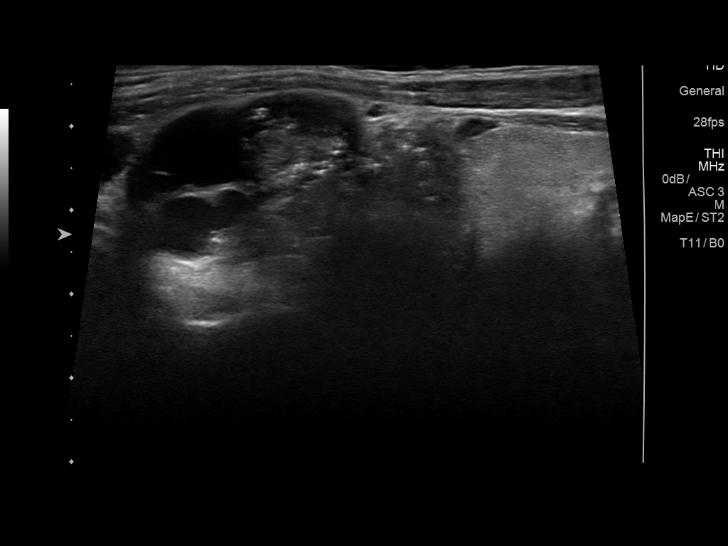
[im 18/62]
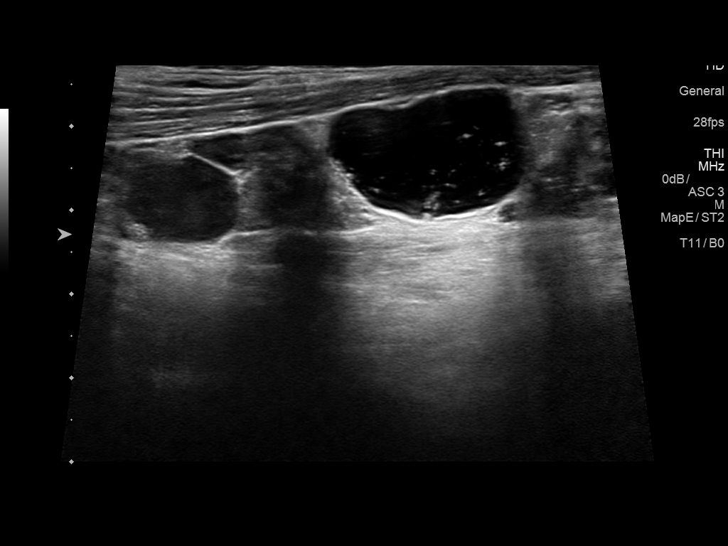
[im 23/62]
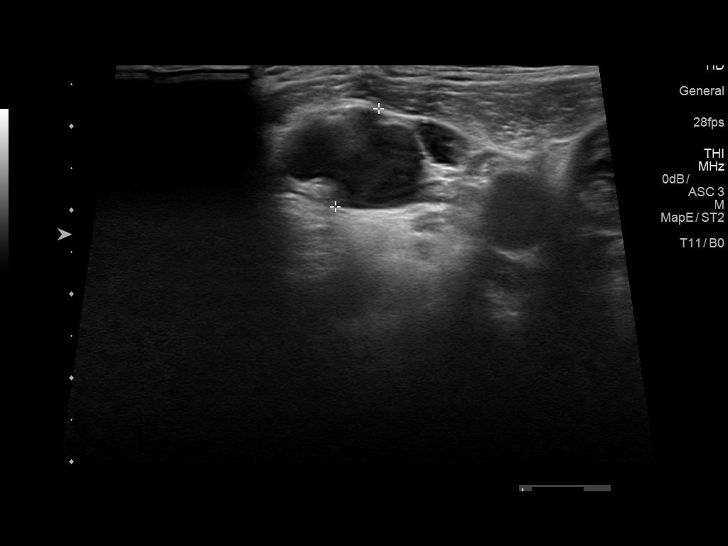
[im 28/62]
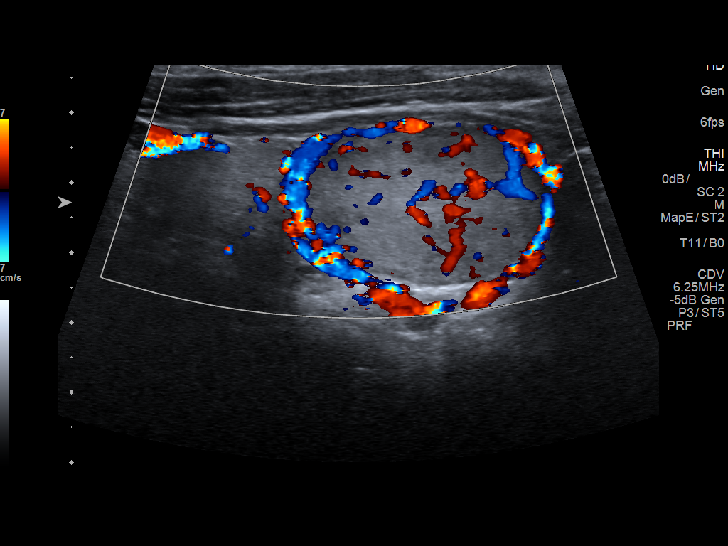
[im 34/62]
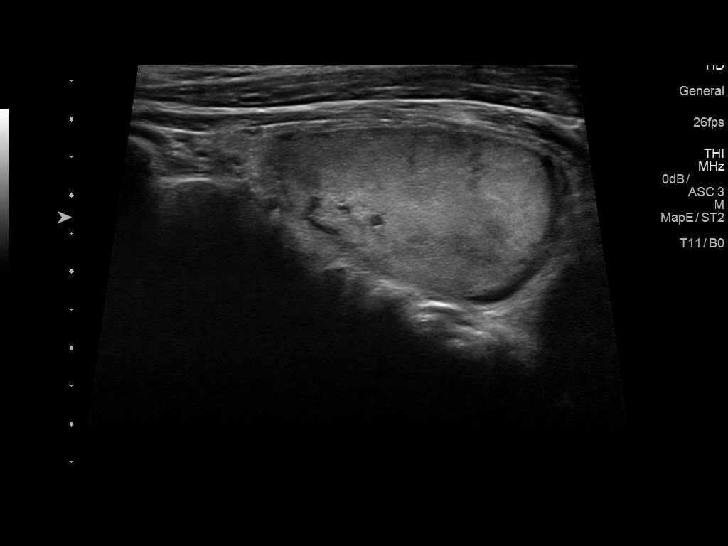
[im 39/62]
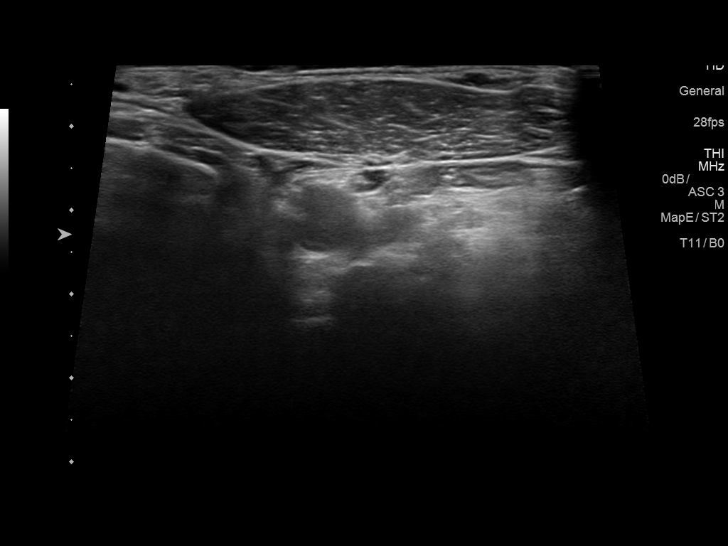
[im 44/62]
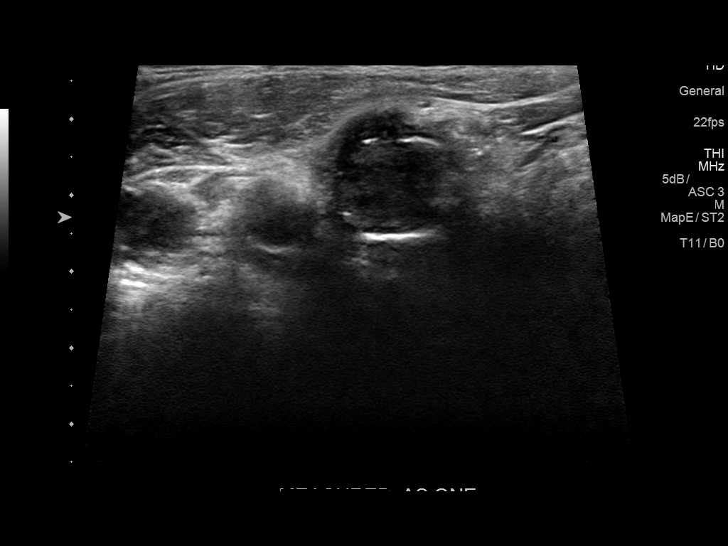
[im 49/62]
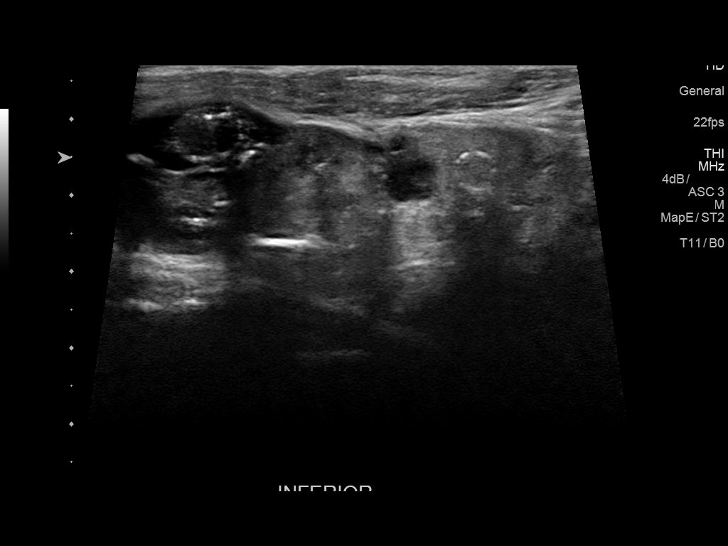
[im 54/62]
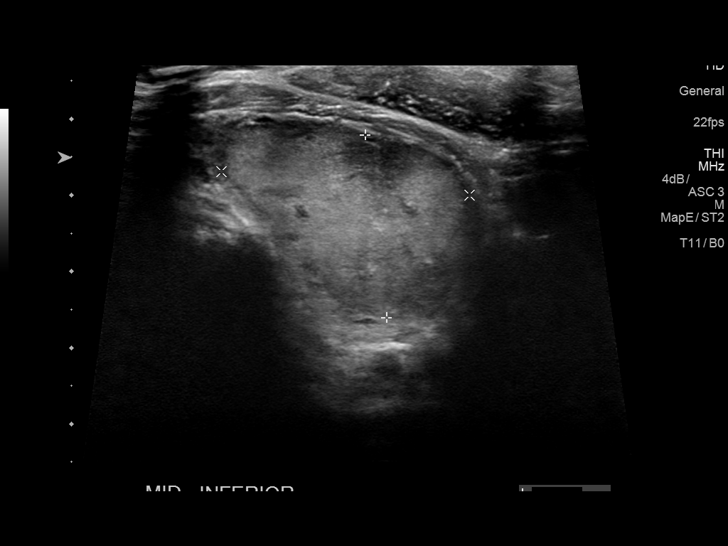
[im 59/62]
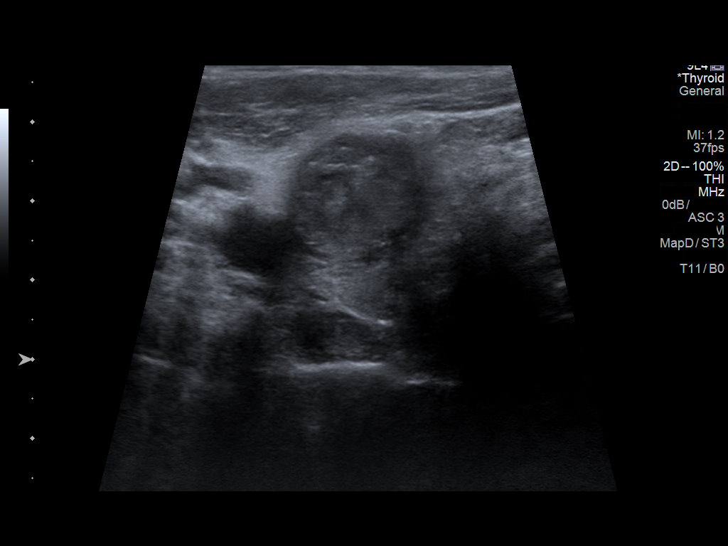

[12 of 25 positions shown; findings below may reference images not displayed]

FINDINGS: Parenchymal Echotexture: Mildly heterogenous

Isthmus: Borderline enlarged measures 0.8 cm in diameter

Right lobe: Borderline enlarged measuring 5.9 x 2.5 x 2.1 cm

Left lobe: Enlarged measuring 6.4 x 2.6 x 3.3 cm

_________________________________________________________

Estimated total number of nodules >/= 1 cm: 2

Number of spongiform nodules >/=  2 cm not described below (TR1): 0

Number of mixed cystic and solid nodules >/= 1.5 cm not described
below (TR2): 0

_________________________________________________________

Nodule # 1:

Location: Right; Superior

Maximum size: 4.8 cm; Other 2 dimensions: 1.7 x 1.6 cm

Composition: solid/almost completely solid (2)

Echogenicity: hypoechoic (2)

Shape: not taller-than-wide (0)

Margins: lobulated/irregular (2)

Echogenic foci: macrocalcifications (1)

Additional echogenic foci 1:  peripheral calcifications (2)

Additional echogenic foci 1:  punctate echogenic foci (3)

ACR TI-RADS total points: 12.

ACR TI-RADS risk category: TR5 (>/= 7 points).

ACR TI-RADS recommendations:

**Given size (>/= 1.0 cm) and appearance, fine needle aspiration of
this highly suspicious nodule should be considered based on TI-RADS
criteria.

_________________________________________________________

Nodule # 2:

Location: Right; Inferior

Maximum size: 0.6 cm; Other 2 dimensions: 0.6 x 0.6 cm

Composition: solid/almost completely solid (2)

Echogenicity: isoechoic (1)

Shape: not taller-than-wide (0)

Margins: smooth (0)

Echogenic foci: peripheral calcifications (2)

ACR TI-RADS total points: 5.

ACR TI-RADS risk category: TR4 (4-6 points).

ACR TI-RADS recommendations:

Given size (<0.9 cm) and appearance, this nodule does NOT meet
TI-RADS criteria for biopsy or dedicated follow-up.

_________________________________________________________

Nodule # 3:

Location: Left; Inferior

Maximum size: 3.8 cm; Other 2 dimensions: 3.3 x 2.4 cm

Composition: solid/almost completely solid (2)

Echogenicity: isoechoic (1)

Shape: not taller-than-wide (0)

Margins: smooth (0)

Echogenic foci: none (0)

ACR TI-RADS total points: 3.

ACR TI-RADS risk category: TR3 (3 points).

ACR TI-RADS recommendations:

**Given size (>/= 2.5 cm) and appearance, fine needle aspiration of
this mildly suspicious nodule should be considered based on TI-RADS
criteria.

_________________________________________________________

Note is made of 2 abnormal predominantly cystic presumed lymph nodes
within the right lateral neck 1 of which measures approximately
cm in greatest short axis diameter (image 26, the other which
measures approximately 1.3 cm (image 28).
IMPRESSION: 1. Findings suggestive multinodular goiter.
2. Nodules labeled 1 and 3 both meet imaging criteria to recommend
percutaneous sampling as clinically indicated.
3. Note is made of 2 abnormal appearing predominantly cystic right
cervical lymph nodes - as these abnormal appearing lymph nodes may
NOT be associated with the described thyroid nodules, further
evaluation with contrast-enhanced neck CT is recommended prior to
proceeding with ultrasound-guided thyroid nodule biopsy for the
purposes of biopsy planning.

The above is in keeping with the ACR TI-RADS recommendations - [HOSPITAL] 6413;[DATE].

These results will be called to the ordering clinician or
representative by the Radiologist Assistant, and communication
documented in the PACS or zVision Dashboard.

## 2020-04-06 IMAGING — CR DG CHEST 2V
2 series · 2 of 2 positions shown · non-contrast
Comparison: None.

CLINICAL DATA: Preoperative evaluation.

EXAM:
CHEST - 2 VIEW

[w chest pa]
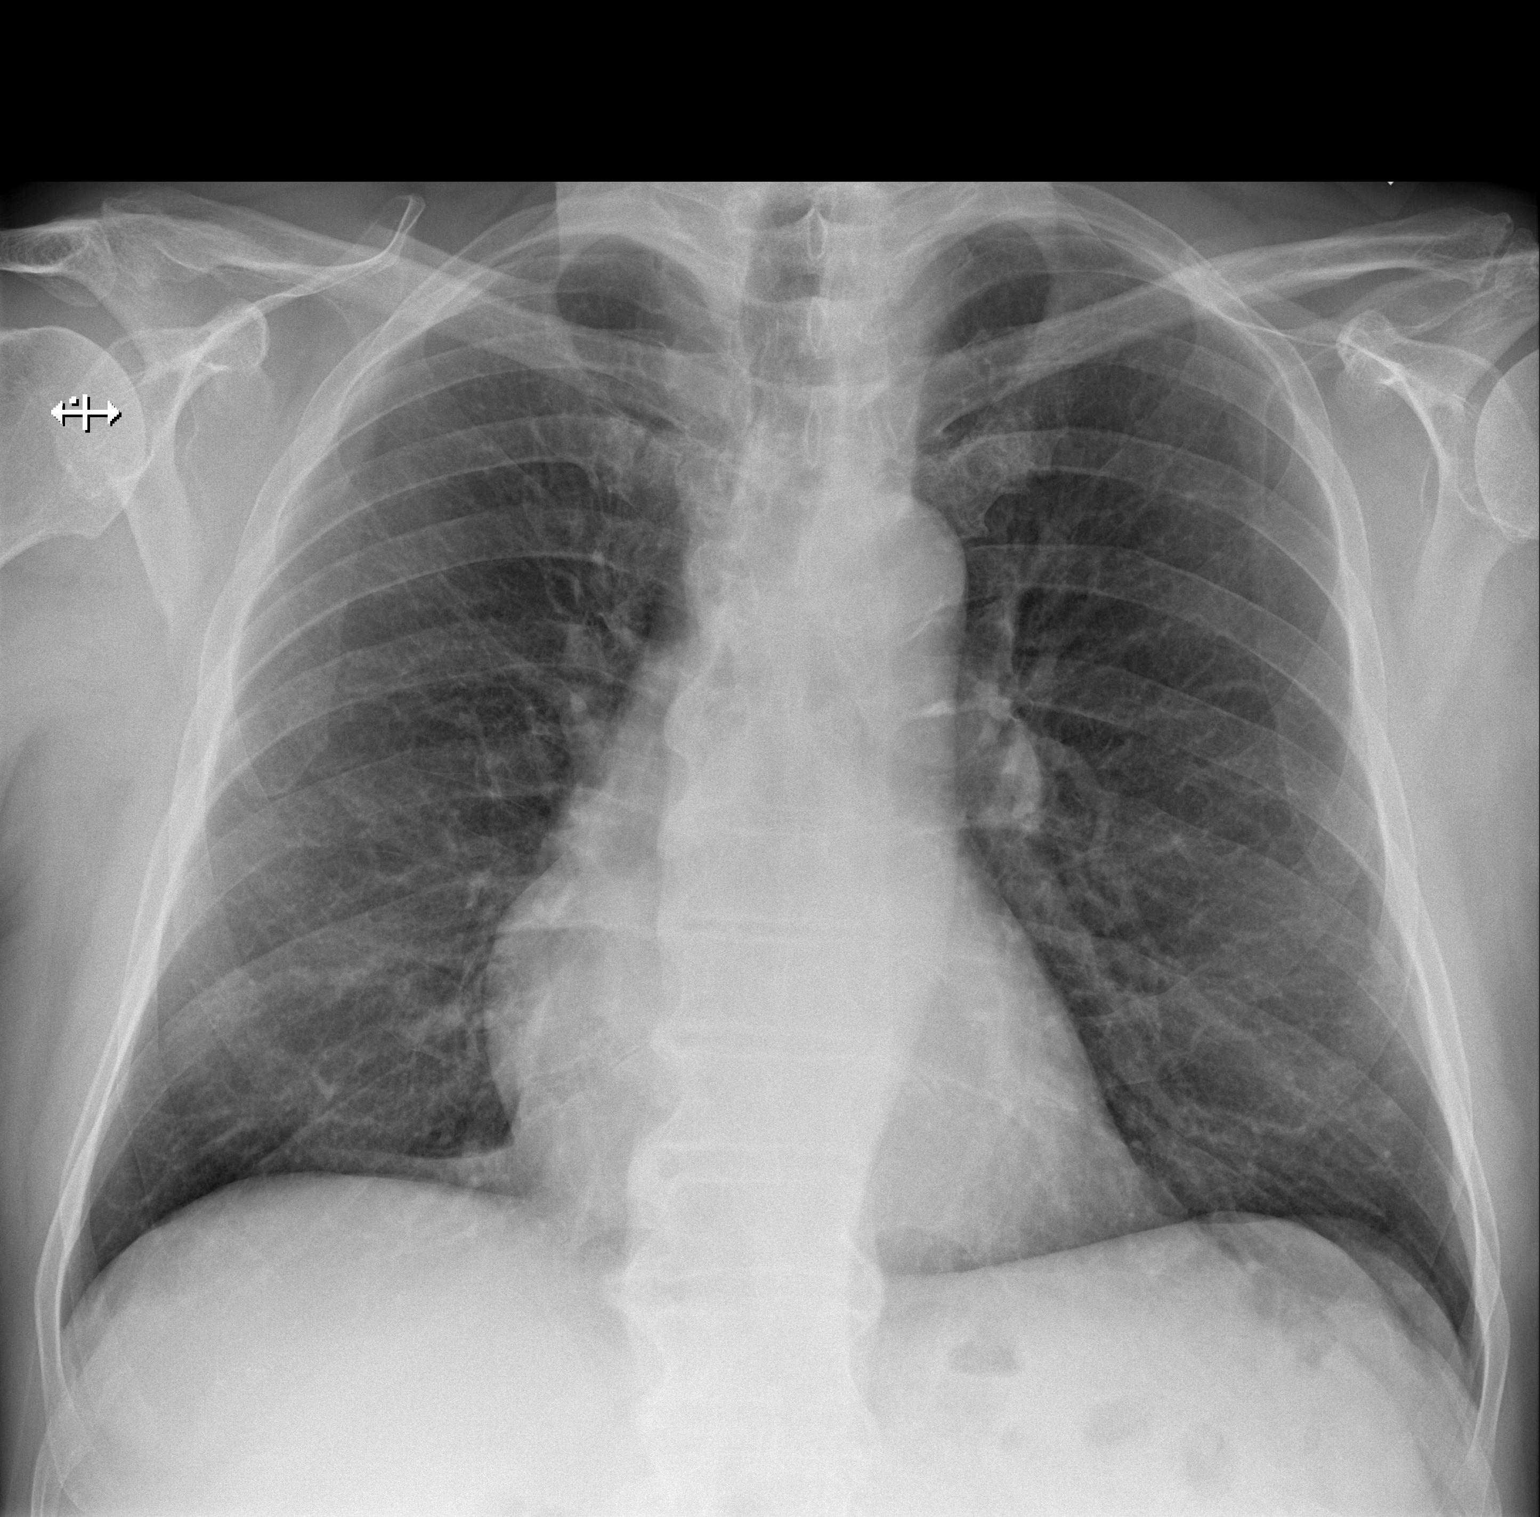

[w chest lat]
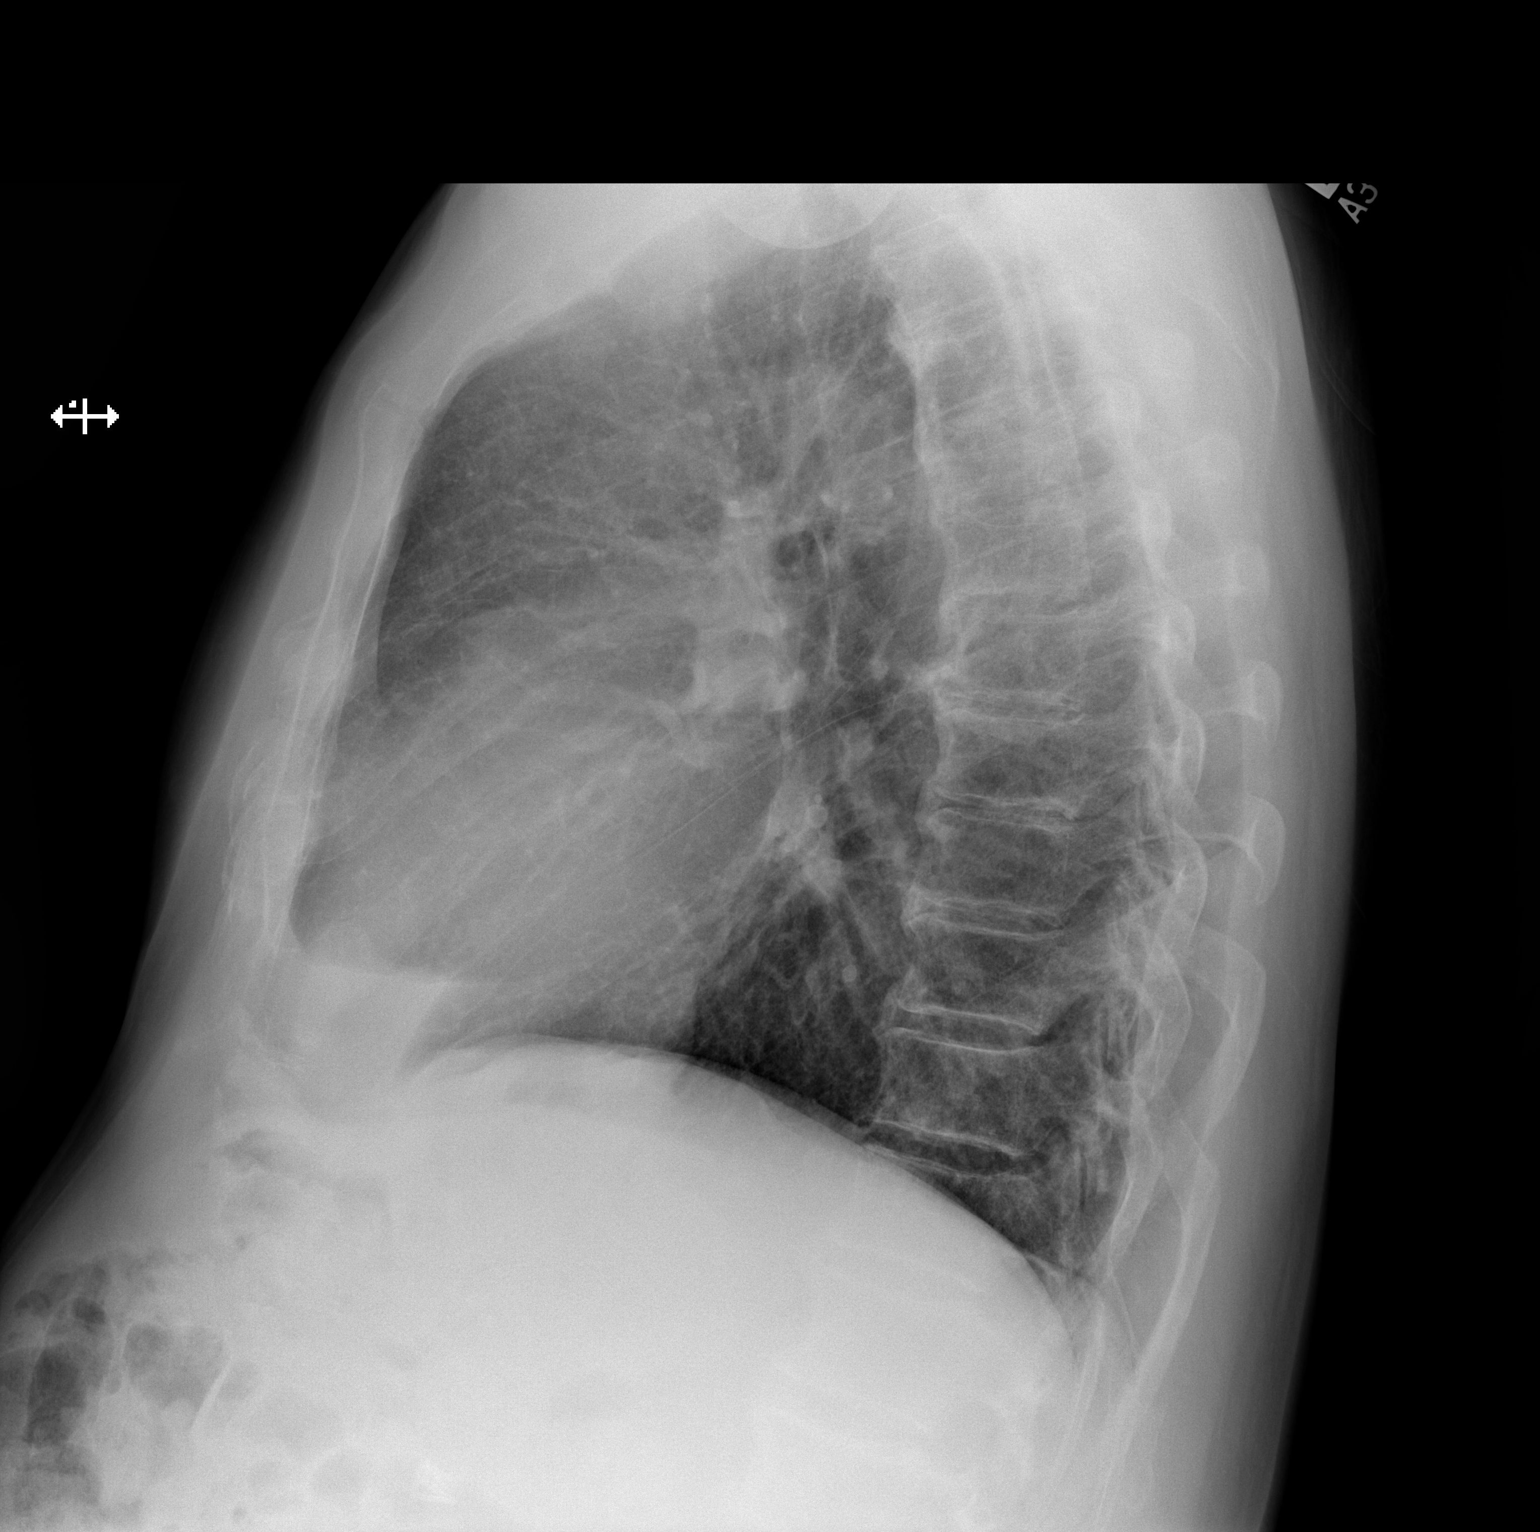

[2 of 2 positions shown; findings below may reference images not displayed]

FINDINGS: Cardiac contours upper limits of normal. No consolidative pulmonary
opacities. No pleural effusion or pneumothorax. Thoracic spine
degenerative changes.
IMPRESSION: No acute cardiopulmonary process.
